# Patient Record
Sex: Female | Born: 1955
Health system: Southern US, Community
[De-identification: ages and names within clinical notes are randomized; demographics above are authoritative.]

## PROBLEM LIST (undated history)

## (undated) DIAGNOSIS — E782 Mixed hyperlipidemia: Secondary | ICD-10-CM

## (undated) DIAGNOSIS — I10 Essential (primary) hypertension: Secondary | ICD-10-CM

## (undated) DIAGNOSIS — I2510 Atherosclerotic heart disease of native coronary artery without angina pectoris: Secondary | ICD-10-CM

## (undated) DIAGNOSIS — E039 Hypothyroidism, unspecified: Secondary | ICD-10-CM

## (undated) DIAGNOSIS — E7800 Pure hypercholesterolemia, unspecified: Secondary | ICD-10-CM

## (undated) HISTORY — DX: Essential (primary) hypertension: I10

## (undated) HISTORY — DX: Hypothyroidism, unspecified: E03.9

## (undated) HISTORY — DX: Atherosclerotic heart disease of native coronary artery without angina pectoris: I25.10

## (undated) HISTORY — DX: Mixed hyperlipidemia: E78.2

## (undated) HISTORY — DX: Pure hypercholesterolemia, unspecified: E78.00

## (undated) HISTORY — PX: TONSILLECTOMY AND ADENOIDECTOMY: SUR1326

---
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
## 1980-01-02 HISTORY — PX: TUBAL LIGATION: SHX77

## 1998-07-08 ENCOUNTER — Encounter: Admission: RE | Admit: 1998-07-08 | Discharge: 1998-10-06 | Payer: Self-pay | Admitting: *Deleted

## 2000-05-23 ENCOUNTER — Encounter (INDEPENDENT_AMBULATORY_CARE_PROVIDER_SITE_OTHER): Payer: Self-pay

## 2000-05-23 ENCOUNTER — Ambulatory Visit (HOSPITAL_COMMUNITY): Admission: RE | Admit: 2000-05-23 | Discharge: 2000-05-23 | Payer: Self-pay | Admitting: Obstetrics and Gynecology

## 2003-06-02 HISTORY — PX: URETHRA SURGERY: SHX824

## 2004-01-02 HISTORY — PX: CHOLECYSTECTOMY: SHX55

## 2005-06-06 HISTORY — PX: FOOT SURGERY: SHX648

## 2009-12-07 ENCOUNTER — Emergency Department (HOSPITAL_COMMUNITY)
Admission: EM | Admit: 2009-12-07 | Discharge: 2009-12-07 | Payer: Self-pay | Source: Home / Self Care | Admitting: Emergency Medicine

## 2010-03-14 LAB — POCT CARDIAC MARKERS
CKMB, poc: <1 ng/mL — ABNORMAL LOW (ref 1.0–8.0)
Myoglobin, poc: 153 ng/mL (ref 12–200)
Myoglobin, poc: 66.2 ng/mL (ref 12–200)
Troponin i, poc: <0.05 ng/mL (ref 0.00–0.09)

## 2010-05-19 NOTE — Op Note (Signed)
[facility]  Patient:    Crystal Fischer, Crystal Fischer                         MRN: [id number] Proc. Date: 05/23/00 Adm. Date:  [id number] Attending:  Lendon Colonel                           Operative Report  PREOPERATIVE DIAGNOSES:  Menorrhagia, uterine fibroids, and anemia.  POSTOPERATIVE DIAGNOSES:  Menorrhagia, uterine fibroids, and anemia.  OPERATION:  Curettage and hysteroscopy with resection.  DESCRIPTION OF PROCEDURE:  The patient was placed in lithotomy position and prepped and draped in the usual fashion.  Cervix dilated and injected with 20 cc of a Pitressin solution.  Hysteroscopy was performed which revealed a prominent lower segment but no definite submucous fibroids.  A general endometrial resection was accomplished without difficulty.  All of the resected material was sent to the lab for study.  At the completion of the resection, a complete curettage was performed to remove all chips.  No unusual blood loss occurred.  [name] tolerated the procedure well and sent to recovery in good condition. DD:  05/23/00 TD:  05/23/00 Job: [id number]

## 2014-07-28 DIAGNOSIS — C4492 Squamous cell carcinoma of skin, unspecified: Secondary | ICD-10-CM

## 2014-07-28 HISTORY — DX: Squamous cell carcinoma of skin, unspecified: C44.92

## 2014-12-20 DIAGNOSIS — C4492 Squamous cell carcinoma of skin, unspecified: Secondary | ICD-10-CM

## 2014-12-20 HISTORY — DX: Squamous cell carcinoma of skin, unspecified: C44.92

## 2015-02-22 ENCOUNTER — Ambulatory Visit (INDEPENDENT_AMBULATORY_CARE_PROVIDER_SITE_OTHER): Payer: 59 | Admitting: Endocrinology

## 2015-02-22 ENCOUNTER — Encounter: Payer: Self-pay | Admitting: Endocrinology

## 2015-02-22 VITALS — BP 146/83 | HR 72 | Temp 98.3°F | Ht 63.5 in | Wt 199.0 lb

## 2015-02-22 DIAGNOSIS — E039 Hypothyroidism, unspecified: Secondary | ICD-10-CM | POA: Insufficient documentation

## 2015-02-22 DIAGNOSIS — E785 Hyperlipidemia, unspecified: Secondary | ICD-10-CM | POA: Diagnosis not present

## 2015-02-22 DIAGNOSIS — I10 Essential (primary) hypertension: Secondary | ICD-10-CM | POA: Diagnosis not present

## 2015-02-22 MED ORDER — LEVOTHYROXINE SODIUM 200 MCG PO TABS: 200.0000 ug | ORAL_TABLET | Freq: Every day | ORAL | Status: DC

## 2015-02-22 NOTE — Progress Notes (Signed)
Subjective:    Patient ID: Crystal Fischer, female    DOB: [birth year], 60 y.o.   MRN: [id number]  HPI Pt reports hypothyroidism was dx'ed in approx 2014.  she has been on prescribed thyroid hormone therapy since then.  she has never taken kelp or any other type of non-prescribed thyroid product.  she has never had thyroid imaging.  She has never had thyroid surgery, or XRT to the neck.  He has never been on amiodarone or lithium.  Despite multiple med adjustments, and different meds, she has moderate cold intolerance throughout the body, and assoc fatigue.  When she was on synthroid, she says dosage ranged from 150-225 mcg/d.   No past medical history on file.  No past surgical history on file.  Social History   Social History  . Marital Status: Married    Spouse Name: N/A  . Number of Children: N/A  . Years of Education: N/A   Occupational History  . Not on file.   Social History Main Topics  . Smoking status: Former Research scientist (life sciences)  . Smokeless tobacco: Not on file  . Alcohol Use: No  . Drug Use: Not on file  . Sexual Activity: Not on file   Other Topics Concern  . Not on file   Social History Narrative  . No narrative on file    No current outpatient prescriptions on file prior to visit.   No current facility-administered medications on file prior to visit.    Allergies  Allergen Reactions  . Codeine Nausea Only    Family History  Problem Relation Age of Onset  . Thyroid disease Neg Hx     BP 146/83 mmHg  Pulse 72  Temp(Src) 98.3 F (36.8 C) (Oral)  Ht 5' 3.5" (1.613 m)  Wt 199 lb (90.266 kg)  BMI 34.69 kg/m2  SpO2 96%  Review of Systems denies depression, hair loss, weight gain, constipation, numbness, blurry vision, easy bruising, and syncope.  She has difficulty with concentration, muscle cramps, doe, rhinorrhea, and dry skin.      Objective:   Physical Exam VS: see vs page GEN: no distress HEAD: head: no deformity eyes: no periorbital swelling, no  proptosis external nose and ears are normal mouth: no lesion seen NECK: supple, thyroid is not enlarged CHEST WALL: no deformity LUNGS:  Clear to auscultation CV: reg rate and rhythm; soft systolic murmur ABD: abdomen is soft, nontender.  no hepatosplenomegaly.  not distended.  no hernia.   MUSCULOSKELETAL: muscle bulk and strength are grossly normal.  no obvious joint swelling.  gait is normal and steady EXTEMITIES: no deformity.  1+ bilat leg edema.   PULSES: no carotid bruit NEURO:  cn 2-12 grossly intact.   readily moves all 4's.  sensation is intact to touch on all 4's.  SKIN:  Normal texture and temperature.  No rash or suspicious lesion is visible.   NODES:  None palpable at the neck PSYCH: alert, well-oriented.  Does not appear anxious nor depressed.  I have reviewed outside records, and summarized: Pt was noted to have variable TSH, and referred here.  outside test results are reviewed: TSH=8     Assessment & Plan:  Hypothyroidism: new to me: characterized by variable TFT Cold intolerance and other sxs: unlikely thyroid-related.   Patient is advised the following: Patient Instructions  Please change your current thyroid medication to synthroid, 200 mcg/day Please redo the blood test in 1 month, here in the office.      Levothyroxine  oral capsules What is this medicine? LEVOTHYROXINE (lee voe thye ROX een) is a thyroid hormone. This medicine can improve symptoms of thyroid deficiency such as slow speech, lack of energy, weight gain, hair loss, dry skin, and feeling cold. It also helps to treat goiter (an enlarged thyroid gland). It is also used to treat some kinds of thyroid cancer along with surgery and other medicines. This medicine may be used for other purposes; ask your health care provider or pharmacist if you have questions. What should I tell my health care provider before I take this medicine? They need to know if you have any of these  conditions: -angina -blood clotting problems -diabetes -dieting or on a weight loss program -fertility problems -heart disease -high levels of thyroid hormone -pituitary gland problem -previous heart attack -an unusual or allergic reaction to levothyroxine, thyroid hormones, other medicines, foods, dyes, or preservatives -pregnant or trying to get pregnant -breast-feeding How should I use this medicine? Take this medicine by mouth with a glass of water. It is best to take on an empty stomach, at least 30 minutes to one hour before breakfast. Avoid taking antacids containing aluminum or magnesium, simethicone, bile acid sequestrants, calcium carbonate, sodium polystyrene sulfonate, ferrous sulfate, and sucralfate within 4 hours of taking this medicine. Do not cut, crush or chew this medicine. Follow the directions on the prescription label. Take at the same time each day. Do not take your medicine more often than directed. Talk to your pediatrician regarding the use of this medicine in children. While this drug may be prescribed for selected conditions, precautions do apply. Since the capsules cannot be crushed or placed in water, they may only be given to infants and children who are able to swallow an intact capsule. Overdosage: If you think you have taken too much of this medicine contact a poison control center or emergency room at once. NOTE: This medicine is only for you. Do not share this medicine with others. What if I miss a dose? If you miss a dose, take it as soon as you can. If it is almost time for your next dose, take only that dose. Do not take double or extra doses. What may interact with this medicine? -amiodarone -antacids -anti-thyroid medicines -calcium supplements -carbamazepine -cholestyramine -colestipol -digoxin -female hormones, including contraceptive or birth control pills -iron supplements -ketamine -liquid nutrition products like Ensure -medicines for  colds and breathing difficulties -medicines for diabetes -medicines for mental depression -medicines or herbals used to decrease weight or appetite -phenobarbital or other barbiturate medications -phenytoin -prednisone or other corticosteroids -rifabutin -rifampin -simethicone -sodium polystyrene sulfonate -soy isoflavones -sucralfate -theophylline -warfarin This list may not describe all possible interactions. Give your health care provider a list of all the medicines, herbs, non-prescription drugs, or dietary supplements you use. Also tell them if you smoke, drink alcohol, or use illegal drugs. Some items may interact with your medicine. What should I watch for while using this medicine? Do not switch brands of this medicine unless your health care professional agrees with the change. Ask questions if you are uncertain. You will need regular exams and occasional blood tests to check the response to treatment. If you are receiving this medicine for an underactive thyroid, it may be several weeks before you notice an improvement. Check with your doctor or health care professional if your symptoms do not improve. It may be necessary for you to take this medicine for the rest of your life. Do not stop using this  medicine unless your doctor or health care professional advises you to. This medicine can affect blood sugar levels. If you have diabetes, check your blood sugar as directed. You may lose some of your hair when you first start treatment. With time, this usually corrects itself. If you are going to have surgery, tell your doctor or health care professional that you are taking this medicine. What side effects may I notice from receiving this medicine? Side effects that you should report to your doctor or health care professional as soon as possible: -allergic reactions like skin rash, itching or hives, swelling of the face, lips, or tongue -chest pain -excessive sweating or intolerance  to heat -fast or irregular heartbeat -nervousness -swelling of ankles, feet, or legs -tremors Side effects that usually do not require medical attention (Report these to your doctor or health care professional if they continue or are bothersome.): -changes in appetite -changes in menstrual periods -diarrhea -hair loss -headache -trouble sleeping -weight loss This list may not describe all possible side effects. Call your doctor for medical advice about side effects. You may report side effects to FDA at 1-800-FDA-1088. Where should I keep my medicine? Keep out of the reach of children. Store at room temperature between 15 and 30 degrees C (59 and 86 degrees F). Protect from light and moisture. Keep container tightly closed. Throw away any unused medicine after the expiration date. NOTE: This sheet is a summary. It may not cover all possible information. If you have questions about this medicine, talk to your doctor, pharmacist, or health care provider.    2016, Elsevier/Gold Standard. (2008-03-11 15:25:58)

## 2015-02-22 NOTE — Patient Instructions (Signed)
Please change your current thyroid medication to synthroid, 200 mcg/day Please redo the blood test in 1 month, here in the office.      Levothyroxine oral capsules What is this medicine? LEVOTHYROXINE (lee voe thye ROX een) is a thyroid hormone. This medicine can improve symptoms of thyroid deficiency such as slow speech, lack of energy, weight gain, hair loss, dry skin, and feeling cold. It also helps to treat goiter (an enlarged thyroid gland). It is also used to treat some kinds of thyroid cancer along with surgery and other medicines. This medicine may be used for other purposes; ask your health care provider or pharmacist if you have questions. What should I tell my health care provider before I take this medicine? They need to know if you have any of these conditions: -angina -blood clotting problems -diabetes -dieting or on a weight loss program -fertility problems -heart disease -high levels of thyroid hormone -pituitary gland problem -previous heart attack -an unusual or allergic reaction to levothyroxine, thyroid hormones, other medicines, foods, dyes, or preservatives -pregnant or trying to get pregnant -breast-feeding How should I use this medicine? Take this medicine by mouth with a glass of water. It is best to take on an empty stomach, at least 30 minutes to one hour before breakfast. Avoid taking antacids containing aluminum or magnesium, simethicone, bile acid sequestrants, calcium carbonate, sodium polystyrene sulfonate, ferrous sulfate, and sucralfate within 4 hours of taking this medicine. Do not cut, crush or chew this medicine. Follow the directions on the prescription label. Take at the same time each day. Do not take your medicine more often than directed. Talk to your pediatrician regarding the use of this medicine in children. While this drug may be prescribed for selected conditions, precautions do apply. Since the capsules cannot be crushed or placed in water,  they may only be given to infants and children who are able to swallow an intact capsule. Overdosage: If you think you have taken too much of this medicine contact a poison control center or emergency room at once. NOTE: This medicine is only for you. Do not share this medicine with others. What if I miss a dose? If you miss a dose, take it as soon as you can. If it is almost time for your next dose, take only that dose. Do not take double or extra doses. What may interact with this medicine? -amiodarone -antacids -anti-thyroid medicines -calcium supplements -carbamazepine -cholestyramine -colestipol -digoxin -female hormones, including contraceptive or birth control pills -iron supplements -ketamine -liquid nutrition products like Ensure -medicines for colds and breathing difficulties -medicines for diabetes -medicines for mental depression -medicines or herbals used to decrease weight or appetite -phenobarbital or other barbiturate medications -phenytoin -prednisone or other corticosteroids -rifabutin -rifampin -simethicone -sodium polystyrene sulfonate -soy isoflavones -sucralfate -theophylline -warfarin This list may not describe all possible interactions. Give your health care provider a list of all the medicines, herbs, non-prescription drugs, or dietary supplements you use. Also tell them if you smoke, drink alcohol, or use illegal drugs. Some items may interact with your medicine. What should I watch for while using this medicine? Do not switch brands of this medicine unless your health care professional agrees with the change. Ask questions if you are uncertain. You will need regular exams and occasional blood tests to check the response to treatment. If you are receiving this medicine for an underactive thyroid, it may be several weeks before you notice an improvement. Check with your doctor or health care professional  if your symptoms do not improve. It may be  necessary for you to take this medicine for the rest of your life. Do not stop using this medicine unless your doctor or health care professional advises you to. This medicine can affect blood sugar levels. If you have diabetes, check your blood sugar as directed. You may lose some of your hair when you first start treatment. With time, this usually corrects itself. If you are going to have surgery, tell your doctor or health care professional that you are taking this medicine. What side effects may I notice from receiving this medicine? Side effects that you should report to your doctor or health care professional as soon as possible: -allergic reactions like skin rash, itching or hives, swelling of the face, lips, or tongue -chest pain -excessive sweating or intolerance to heat -fast or irregular heartbeat -nervousness -swelling of ankles, feet, or legs -tremors Side effects that usually do not require medical attention (Report these to your doctor or health care professional if they continue or are bothersome.): -changes in appetite -changes in menstrual periods -diarrhea -hair loss -headache -trouble sleeping -weight loss This list may not describe all possible side effects. Call your doctor for medical advice about side effects. You may report side effects to FDA at 1-800-FDA-1088. Where should I keep my medicine? Keep out of the reach of children. Store at room temperature between 15 and 30 degrees C (59 and 86 degrees F). Protect from light and moisture. Keep container tightly closed. Throw away any unused medicine after the expiration date. NOTE: This sheet is a summary. It may not cover all possible information. If you have questions about this medicine, talk to your doctor, pharmacist, or health care provider.    2016, Elsevier/Gold Standard. (2008-03-11 15:25:58)

## 2015-03-22 ENCOUNTER — Other Ambulatory Visit (INDEPENDENT_AMBULATORY_CARE_PROVIDER_SITE_OTHER): Payer: 59

## 2015-03-22 DIAGNOSIS — E039 Hypothyroidism, unspecified: Secondary | ICD-10-CM | POA: Diagnosis not present

## 2015-03-22 LAB — TSH: TSH: 0.49 u[IU]/mL (ref 0.35–4.50)

## 2015-03-22 LAB — T4, FREE: Free T4: 1.49 ng/dL (ref 0.60–1.60)

## 2015-04-08 ENCOUNTER — Ambulatory Visit (INDEPENDENT_AMBULATORY_CARE_PROVIDER_SITE_OTHER): Payer: 59 | Admitting: Cardiovascular Disease

## 2015-04-08 ENCOUNTER — Other Ambulatory Visit: Payer: Self-pay | Admitting: *Deleted

## 2015-04-08 ENCOUNTER — Encounter: Payer: Self-pay | Admitting: *Deleted

## 2015-04-08 VITALS — BP 148/84 | HR 76 | Ht 63.0 in | Wt 200.0 lb

## 2015-04-08 DIAGNOSIS — I10 Essential (primary) hypertension: Secondary | ICD-10-CM

## 2015-04-08 DIAGNOSIS — R011 Cardiac murmur, unspecified: Secondary | ICD-10-CM | POA: Diagnosis not present

## 2015-04-08 DIAGNOSIS — R0602 Shortness of breath: Secondary | ICD-10-CM

## 2015-04-08 DIAGNOSIS — E785 Hyperlipidemia, unspecified: Secondary | ICD-10-CM

## 2015-04-08 NOTE — Patient Instructions (Signed)
Your physician recommends that you schedule a follow-up appointment in: 3 week with Dr. Bronson Ing  Your physician has requested that you have an echocardiogram. Echocardiography is a painless test that uses sound waves to create images of your heart. It provides your doctor with information about the size and shape of your heart and how well your heart's chambers and valves are working. This procedure takes approximately one hour. There are no restrictions for this procedure.   Your physician has requested that you have en exercise stress myoview. For further information please visit [email or web address]. Please follow instruction sheet, as given.  Thank you for choosing [facility]!!

## 2015-04-08 NOTE — Progress Notes (Signed)
Patient ID: Crystal Fischer, female   DOB: June 29, 1955, 60 y.o.   MRN: [id number]       CARDIOLOGY CONSULT NOTE  Patient ID: Crystal Fischer MRN: [id number] DOB/AGE: 11/20/1955 61 y.o.  Admit date: (Not on file) Primary Physician Manon Hilding, MD  Reason for Consultation: shortness of breath  HPI: The patient is a 60 year old female with a history of hypertension, hyperlipidemia, hypothyroidism, and obesity, with a prior history of smoking but quit 20 years ago. Earlier this month she was pulling a 100 lb wagon outdoors up a hill and by the time she finished she was "seeing black spots" and experienced some shortness of breath. She denied chest pain, palpitations, and diaphoresis. Blood pressures have been ranging from 160-179/90-109. Labs performed on 04/06/15 showed hemoglobin 12.3, platelets 306, BUN 9, creatinine 0.68, TSH 0.288.  SheHas noticed a progressive decline in her exercise tolerance over the past 6-8 months. She has had progressive exertional dyspnea associated with throat and neck pain. She used to walk 3 miles daily and is now only able to walk 1 mile. She also complains of upper back pain and occasional mild dizziness but denies syncope. She has had leg swelling for the past several months and takes Lasix 20 mg.  She recently had an ECG done at PCPs office but this is not currently available to me.   Allergies  Allergen Reactions  . Codeine Nausea Only    Current Outpatient Prescriptions  Medication Sig Dispense Refill  . atorvastatin (LIPITOR) 10 MG tablet Take 10 mg by mouth. 2 times per week/;    . Calcium Carb-Cholecalciferol (CALCIUM 600 + D PO) Take by mouth.    . furosemide (LASIX) 20 MG tablet Take 20 mg by mouth.    . levothyroxine (SYNTHROID, LEVOTHROID) 200 MCG tablet Take 1 tablet (200 mcg total) by mouth daily before breakfast. 30 tablet 5  . lisinopril (PRINIVIL,ZESTRIL) 10 MG tablet Take 10 mg by mouth daily.    . Magnesium 250 MG TABS Take 250 mg by mouth.      . zolpidem (AMBIEN) 10 MG tablet Take 1 tablet by mouth at bedtime as needed.  3   No current facility-administered medications for this visit.    Past Medical History  Diagnosis Date  . Hypertension   . Hypercholesteremia   . Hypothyroidism     Past Surgical History  Procedure Laterality Date  . Tubal ligation  1982  . Foot surgery  06/06/05    bunion  . Cholecystectomy  2006  . Urethra surgery  06-2003    urethral suspension  . Appendectomy  1971  . Tonsillectomy and adenoidectomy      Social History   Social History  . Marital Status: Married    Spouse Name: N/A  . Number of Children: N/A  . Years of Education: N/A   Occupational History  . Not on file.   Social History Main Topics  . Smoking status: Former Smoker -- 20 years    Types: Cigarettes  . Smokeless tobacco: Never Used  . Alcohol Use: No  . Drug Use: Not on file  . Sexual Activity: Not on file   Other Topics Concern  . Not on file   Social History Narrative     No family history of premature CAD in 1st degree relatives.  Prior to Admission medications   Medication Sig Start Date End Date Taking? Authorizing Provider  atorvastatin (LIPITOR) 10 MG tablet Take 10 mg by mouth. 2 times per  week/;   Yes Historical Provider, MD  Calcium Carb-Cholecalciferol (CALCIUM 600 + D PO) Take by mouth.   Yes Historical Provider, MD  furosemide (LASIX) 20 MG tablet Take 20 mg by mouth.   Yes Historical Provider, MD  levothyroxine (SYNTHROID, LEVOTHROID) 200 MCG tablet Take 1 tablet (200 mcg total) by mouth daily before breakfast. 02/22/15  Yes Renato Shin, MD  lisinopril (PRINIVIL,ZESTRIL) 10 MG tablet Take 10 mg by mouth daily.   Yes Historical Provider, MD  Magnesium 250 MG TABS Take 250 mg by mouth.   Yes Historical Provider, MD  zolpidem (AMBIEN) 10 MG tablet Take 1 tablet by mouth at bedtime as needed. 03/03/15  Yes Historical Provider, MD     Review of systems complete and found to be negative unless  listed above in HPI     Physical exam Blood pressure 148/84, pulse 76, height 5\' 3"  (1.6 m), weight 200 lb (90.719 kg), SpO2 96 %. General: NAD Neck: No JVD, no thyromegaly or thyroid nodule.  Lungs: Clear to auscultation bilaterally with normal respiratory effort. CV: Nondisplaced PMI. Regular rate and rhythm, normal S1/S2, no [id number], soft 1/6 systolic murmur over RUSB and along LSB.  Trace pretibial edema.  No carotid bruit.    Abdomen: Soft, nontender, obese.  Skin: Intact without lesions or rashes.  Neurologic: Alert and oriented x 3.  Psych: Normal affect. Extremities: No clubbing or cyanosis.  HEENT: Normal.   ECG: Most recent ECG reviewed.  Labs:  No results found for: WBC, HGB, HCT, MCV, PLT No results for input(s): NA, K, CL, CO2, BUN, CREATININE, CALCIUM, PROT, BILITOT, ALKPHOS, ALT, AST, GLUCOSE in the last 168 hours.  Invalid input(s): LABALBU No results found for: CKTOTAL, CKMB, CKMBINDEX, TROPONINI No results found for: CHOL No results found for: HDL No results found for: LDLCALC No results found for: TRIG No results found for: CHOLHDL No results found for: LDLDIRECT       Studies: No results found.  ASSESSMENT AND PLAN:  1. Shortness of breath/decreased exercise tolerance: Given her progressive decline in exercise tolerance associated with throat pain, symptoms are concerning for occult ischemic heart disease. We'll proceed with an exercise Cardiolite stress test. I will order a 2-D echocardiogram with Doppler to evaluate cardiac structure, function, and regional wall motion. Will review ECG once made available.  2. Essential HTN: Mildly elevated. Will monitor without medication adjustments today.  3. Hyperlipidemia: Continue statin therapy.  4. Murmur: I will order a 2-D echocardiogram with Doppler to evaluate cardiac structure, function, and regional wall motion.   Dispo: fu 3 weeks.   Signed: Kate Sable, M.D., F.A.C.C.  04/08/2015, 8:42  AM

## 2015-04-15 ENCOUNTER — Inpatient Hospital Stay (HOSPITAL_COMMUNITY): Admission: RE | Admit: 2015-04-15 | Payer: 59 | Source: Ambulatory Visit

## 2015-04-15 ENCOUNTER — Encounter (HOSPITAL_COMMUNITY)
Admission: RE | Admit: 2015-04-15 | Discharge: 2015-04-15 | Disposition: A | Discharge status: Home / Self Care | Payer: 59 | Source: Ambulatory Visit | Attending: Cardiovascular Disease | Admitting: Cardiovascular Disease

## 2015-04-15 ENCOUNTER — Ambulatory Visit (HOSPITAL_COMMUNITY)
Admission: RE | Admit: 2015-04-15 | Discharge: 2015-04-15 | Disposition: A | Discharge status: Home / Self Care | Payer: 59 | Source: Ambulatory Visit | Attending: Cardiovascular Disease | Admitting: Cardiovascular Disease

## 2015-04-15 ENCOUNTER — Encounter (HOSPITAL_COMMUNITY): Payer: Self-pay

## 2015-04-15 DIAGNOSIS — I059 Rheumatic mitral valve disease, unspecified: Secondary | ICD-10-CM | POA: Insufficient documentation

## 2015-04-15 DIAGNOSIS — I358 Other nonrheumatic aortic valve disorders: Secondary | ICD-10-CM | POA: Diagnosis not present

## 2015-04-15 DIAGNOSIS — R0602 Shortness of breath: Secondary | ICD-10-CM | POA: Diagnosis not present

## 2015-04-15 DIAGNOSIS — I119 Hypertensive heart disease without heart failure: Secondary | ICD-10-CM | POA: Insufficient documentation

## 2015-04-15 DIAGNOSIS — E785 Hyperlipidemia, unspecified: Secondary | ICD-10-CM | POA: Diagnosis not present

## 2015-04-15 DIAGNOSIS — R011 Cardiac murmur, unspecified: Secondary | ICD-10-CM | POA: Diagnosis present

## 2015-04-15 LAB — NM MYOCAR MULTI W/SPECT W/WALL MOTION / EF
CHL CUP MPHR: 159 {beats}/min
CHL CUP NUCLEAR SDS: 5
CHL CUP NUCLEAR SSS: 12
CSEPHR: 91 %
Estimated workload: 10.1 METS
Exercise duration (min): 7 min
Exercise duration (sec): 16 s
LHR: 0.23
LV dias vol: 56 mL (ref 46–106)
LV sys vol: 7 mL
Peak HR: 146 {beats}/min
RPE: 15
Rest HR: 67 {beats}/min
SRS: 7
TID: 0.91

## 2015-04-15 MED ORDER — TECHNETIUM TC 99M SESTAMIBI GENERIC - CARDIOLITE
10.0000 | Freq: Once | INTRAVENOUS | Status: AC | PRN
  Administered 2015-04-15: 10 via INTRAVENOUS

## 2015-04-15 MED ORDER — TECHNETIUM TC 99M SESTAMIBI - CARDIOLITE
30.0000 | Freq: Once | INTRAVENOUS | Status: AC | PRN
  Administered 2015-04-15: 10:00:00 30 via INTRAVENOUS

## 2015-04-15 MED ORDER — SODIUM CHLORIDE 0.9% FLUSH
INTRAVENOUS | Status: AC
  Filled 2015-04-15: qty 100

## 2015-05-03 ENCOUNTER — Ambulatory Visit (INDEPENDENT_AMBULATORY_CARE_PROVIDER_SITE_OTHER): Payer: 59 | Admitting: Cardiovascular Disease

## 2015-05-03 ENCOUNTER — Encounter: Payer: Self-pay | Admitting: Cardiovascular Disease

## 2015-05-03 VITALS — BP 122/80 | HR 69 | Ht 63.0 in | Wt 204.0 lb

## 2015-05-03 DIAGNOSIS — Z7182 Exercise counseling: Secondary | ICD-10-CM

## 2015-05-03 DIAGNOSIS — E785 Hyperlipidemia, unspecified: Secondary | ICD-10-CM

## 2015-05-03 DIAGNOSIS — R0602 Shortness of breath: Secondary | ICD-10-CM

## 2015-05-03 DIAGNOSIS — R011 Cardiac murmur, unspecified: Secondary | ICD-10-CM

## 2015-05-03 DIAGNOSIS — I10 Essential (primary) hypertension: Secondary | ICD-10-CM

## 2015-05-03 DIAGNOSIS — Z7189 Other specified counseling: Secondary | ICD-10-CM

## 2015-05-03 NOTE — Patient Instructions (Signed)
Your physician recommends that you schedule a follow-up appointment in: AS NEEDED WITH DR KONESWARAN  Your physician recommends that you continue on your current medications as directed. Please refer to the Current Medication list given to you today.  Thank you for choosing [facility]!!    

## 2015-05-03 NOTE — Progress Notes (Signed)
Patient ID: Crystal Fischer, female   DOB: 01-Dec-1955, 60 y.o.   MRN: [id number]      SUBJECTIVE: The patient returns for follow-up after undergoing cardiovascular testing performed for the evaluation of shortness of breath.  Nuclear stress test 04/15/15 normal.  Echocardiogram showed normal left ventricular systolic function and regional wall motion with grade 1 diastolic dysfunction and aortic valve sclerosis without stenosis.  She has begun to feel better with less exertional dyspnea. She used to walk 3 miles daily but stopped doing so in the winter. She has a treadmill at home and at work but has not been using it. She now feels more motivated to begin exercising again.  Review of Systems: As per "subjective", otherwise negative.  Allergies  Allergen Reactions  . Codeine Nausea Only    Current Outpatient Prescriptions  Medication Sig Dispense Refill  . atorvastatin (LIPITOR) 10 MG tablet Take 10 mg by mouth. 2 times per week/;    . Calcium Carb-Cholecalciferol (CALCIUM 600 + D PO) Take by mouth.    . furosemide (LASIX) 20 MG tablet Take 20 mg by mouth.    . levothyroxine (SYNTHROID, LEVOTHROID) 175 MCG tablet Take 175 mcg by mouth daily before breakfast.    . lisinopril (PRINIVIL,ZESTRIL) 10 MG tablet Take 10 mg by mouth daily.    . Magnesium 250 MG TABS Take 250 mg by mouth.    . zolpidem (AMBIEN) 10 MG tablet Take 1 tablet by mouth at bedtime as needed.  3   No current facility-administered medications for this visit.    Past Medical History  Diagnosis Date  . Hypertension   . Hypercholesteremia   . Hypothyroidism     Past Surgical History  Procedure Laterality Date  . Tubal ligation  1982  . Foot surgery  06/06/05    bunion  . Cholecystectomy  2006  . Urethra surgery  06-2003    urethral suspension  . Appendectomy  1971  . Tonsillectomy and adenoidectomy      Social History   Social History  . Marital Status: Married    Spouse Name: N/A  . Number of Children:  N/A  . Years of Education: N/A   Occupational History  . Not on file.   Social History Main Topics  . Smoking status: Former Smoker -- 20 years    Types: Cigarettes  . Smokeless tobacco: Never Used  . Alcohol Use: No  . Drug Use: Not on file  . Sexual Activity: Not on file   Other Topics Concern  . Not on file   Social History Narrative     Filed Vitals:   05/03/15 1602  BP: 122/80  Pulse: 69  Height: 5\' 3"  (1.6 m)  Weight: 204 lb (92.534 kg)  SpO2: 96%    PHYSICAL EXAM General: NAD Neck: No JVD, no thyromegaly or thyroid nodule.  Lungs: Clear to auscultation bilaterally with normal respiratory effort. CV: Nondisplaced PMI. Regular rate and rhythm, normal S1/S2, no XX123456, soft 1/6 systolic murmur over RUSB and along LSB. Trace pretibial edema. No carotid bruit.  Abdomen: Soft, nontender, obese.  Skin: Intact without lesions or rashes.  Neurologic: Alert and oriented x 3.  Psych: Normal affect. Extremities: No clubbing or cyanosis.  HEENT: Normal.   ECG: Most recent ECG reviewed.      ASSESSMENT AND PLAN: 1. Shortness of breath/decreased exercise tolerance: Normal nuclear stress test with normal LV systolic function and regional wall motion. No further CV testing indicated. Likely due to CV deconditioning.  Exercise encouraged.  2. Essential HTN: Controlled. No changes.  3. Hyperlipidemia: Continue statin therapy.  4. Murmur: Aortic valve sclerosis without stenosis.   Dispo: fu prn.   Kate Sable, M.D., F.A.C.C.

## 2016-01-23 DIAGNOSIS — E782 Mixed hyperlipidemia: Secondary | ICD-10-CM | POA: Diagnosis not present

## 2016-01-23 DIAGNOSIS — E539 Vitamin B deficiency, unspecified: Secondary | ICD-10-CM | POA: Diagnosis not present

## 2016-01-23 DIAGNOSIS — E7800 Pure hypercholesterolemia, unspecified: Secondary | ICD-10-CM | POA: Diagnosis not present

## 2016-01-23 DIAGNOSIS — I10 Essential (primary) hypertension: Secondary | ICD-10-CM | POA: Diagnosis not present

## 2016-01-23 DIAGNOSIS — E038 Other specified hypothyroidism: Secondary | ICD-10-CM | POA: Diagnosis not present

## 2016-02-02 DIAGNOSIS — E038 Other specified hypothyroidism: Secondary | ICD-10-CM | POA: Diagnosis not present

## 2016-02-02 DIAGNOSIS — E782 Mixed hyperlipidemia: Secondary | ICD-10-CM | POA: Diagnosis not present

## 2016-03-21 DIAGNOSIS — M25512 Pain in left shoulder: Secondary | ICD-10-CM | POA: Diagnosis not present

## 2016-03-23 DIAGNOSIS — M25512 Pain in left shoulder: Secondary | ICD-10-CM | POA: Diagnosis not present

## 2016-03-29 DIAGNOSIS — M25512 Pain in left shoulder: Secondary | ICD-10-CM | POA: Diagnosis not present

## 2016-04-24 DIAGNOSIS — R300 Dysuria: Secondary | ICD-10-CM | POA: Diagnosis not present

## 2016-04-24 DIAGNOSIS — N3000 Acute cystitis without hematuria: Secondary | ICD-10-CM | POA: Diagnosis not present

## 2016-05-22 DIAGNOSIS — R300 Dysuria: Secondary | ICD-10-CM | POA: Diagnosis not present

## 2016-06-19 DIAGNOSIS — H40053 Ocular hypertension, bilateral: Secondary | ICD-10-CM | POA: Diagnosis not present

## 2016-07-16 DIAGNOSIS — I10 Essential (primary) hypertension: Secondary | ICD-10-CM | POA: Diagnosis not present

## 2016-07-16 DIAGNOSIS — E038 Other specified hypothyroidism: Secondary | ICD-10-CM | POA: Diagnosis not present

## 2016-07-16 DIAGNOSIS — E782 Mixed hyperlipidemia: Secondary | ICD-10-CM | POA: Diagnosis not present

## 2016-07-16 DIAGNOSIS — E7800 Pure hypercholesterolemia, unspecified: Secondary | ICD-10-CM | POA: Diagnosis not present

## 2016-07-16 DIAGNOSIS — R5383 Other fatigue: Secondary | ICD-10-CM | POA: Diagnosis not present

## 2016-07-18 DIAGNOSIS — E782 Mixed hyperlipidemia: Secondary | ICD-10-CM | POA: Diagnosis not present

## 2016-07-18 DIAGNOSIS — E038 Other specified hypothyroidism: Secondary | ICD-10-CM | POA: Diagnosis not present

## 2016-08-13 DIAGNOSIS — Z01419 Encounter for gynecological examination (general) (routine) without abnormal findings: Secondary | ICD-10-CM | POA: Diagnosis not present

## 2016-08-13 DIAGNOSIS — R39198 Other difficulties with micturition: Secondary | ICD-10-CM | POA: Diagnosis not present

## 2016-08-22 DIAGNOSIS — L570 Actinic keratosis: Secondary | ICD-10-CM | POA: Diagnosis not present

## 2016-08-22 DIAGNOSIS — D229 Melanocytic nevi, unspecified: Secondary | ICD-10-CM | POA: Diagnosis not present

## 2016-08-22 DIAGNOSIS — L821 Other seborrheic keratosis: Secondary | ICD-10-CM | POA: Diagnosis not present

## 2016-08-30 DIAGNOSIS — E038 Other specified hypothyroidism: Secondary | ICD-10-CM | POA: Diagnosis not present

## 2016-10-09 DIAGNOSIS — M654 Radial styloid tenosynovitis [de Quervain]: Secondary | ICD-10-CM | POA: Diagnosis not present

## 2016-11-08 DIAGNOSIS — E7800 Pure hypercholesterolemia, unspecified: Secondary | ICD-10-CM | POA: Diagnosis not present

## 2016-11-08 DIAGNOSIS — I10 Essential (primary) hypertension: Secondary | ICD-10-CM | POA: Diagnosis not present

## 2016-11-08 DIAGNOSIS — E038 Other specified hypothyroidism: Secondary | ICD-10-CM | POA: Diagnosis not present

## 2016-11-08 DIAGNOSIS — E782 Mixed hyperlipidemia: Secondary | ICD-10-CM | POA: Diagnosis not present

## 2016-11-13 DIAGNOSIS — E782 Mixed hyperlipidemia: Secondary | ICD-10-CM | POA: Diagnosis not present

## 2016-11-13 DIAGNOSIS — E038 Other specified hypothyroidism: Secondary | ICD-10-CM | POA: Diagnosis not present

## 2017-01-25 DIAGNOSIS — Z7689 Persons encountering health services in other specified circumstances: Secondary | ICD-10-CM | POA: Diagnosis not present

## 2017-01-25 DIAGNOSIS — N3001 Acute cystitis with hematuria: Secondary | ICD-10-CM | POA: Diagnosis not present

## 2017-03-07 DIAGNOSIS — E782 Mixed hyperlipidemia: Secondary | ICD-10-CM | POA: Diagnosis not present

## 2017-03-07 DIAGNOSIS — E0580 Other thyrotoxicosis without thyrotoxic crisis or storm: Secondary | ICD-10-CM | POA: Diagnosis not present

## 2017-03-07 DIAGNOSIS — I10 Essential (primary) hypertension: Secondary | ICD-10-CM | POA: Diagnosis not present

## 2017-03-07 DIAGNOSIS — R609 Edema, unspecified: Secondary | ICD-10-CM | POA: Diagnosis not present

## 2017-03-12 DIAGNOSIS — E038 Other specified hypothyroidism: Secondary | ICD-10-CM | POA: Diagnosis not present

## 2017-03-12 DIAGNOSIS — E782 Mixed hyperlipidemia: Secondary | ICD-10-CM | POA: Diagnosis not present

## 2017-04-29 DIAGNOSIS — J309 Allergic rhinitis, unspecified: Secondary | ICD-10-CM | POA: Diagnosis not present

## 2017-04-29 DIAGNOSIS — R05 Cough: Secondary | ICD-10-CM | POA: Diagnosis not present

## 2017-04-29 DIAGNOSIS — J029 Acute pharyngitis, unspecified: Secondary | ICD-10-CM | POA: Diagnosis not present

## 2017-05-04 DIAGNOSIS — R05 Cough: Secondary | ICD-10-CM | POA: Diagnosis not present

## 2017-07-09 DIAGNOSIS — H35033 Hypertensive retinopathy, bilateral: Secondary | ICD-10-CM | POA: Diagnosis not present

## 2017-07-23 DIAGNOSIS — Z1231 Encounter for screening mammogram for malignant neoplasm of breast: Secondary | ICD-10-CM | POA: Diagnosis not present

## 2017-07-23 DIAGNOSIS — Z01419 Encounter for gynecological examination (general) (routine) without abnormal findings: Secondary | ICD-10-CM | POA: Diagnosis not present

## 2017-09-03 DIAGNOSIS — D229 Melanocytic nevi, unspecified: Secondary | ICD-10-CM | POA: Diagnosis not present

## 2017-09-03 DIAGNOSIS — D485 Neoplasm of uncertain behavior of skin: Secondary | ICD-10-CM | POA: Diagnosis not present

## 2017-09-03 DIAGNOSIS — L719 Rosacea, unspecified: Secondary | ICD-10-CM | POA: Diagnosis not present

## 2017-09-09 DIAGNOSIS — E038 Other specified hypothyroidism: Secondary | ICD-10-CM | POA: Diagnosis not present

## 2017-09-09 DIAGNOSIS — R5383 Other fatigue: Secondary | ICD-10-CM | POA: Diagnosis not present

## 2017-09-09 DIAGNOSIS — E782 Mixed hyperlipidemia: Secondary | ICD-10-CM | POA: Diagnosis not present

## 2017-09-09 DIAGNOSIS — I10 Essential (primary) hypertension: Secondary | ICD-10-CM | POA: Diagnosis not present

## 2017-09-12 DIAGNOSIS — E038 Other specified hypothyroidism: Secondary | ICD-10-CM | POA: Diagnosis not present

## 2017-09-12 DIAGNOSIS — E782 Mixed hyperlipidemia: Secondary | ICD-10-CM | POA: Diagnosis not present

## 2017-11-07 DIAGNOSIS — Z6836 Body mass index (BMI) 36.0-36.9, adult: Secondary | ICD-10-CM | POA: Diagnosis not present

## 2017-11-07 DIAGNOSIS — N3000 Acute cystitis without hematuria: Secondary | ICD-10-CM | POA: Diagnosis not present

## 2017-11-29 DIAGNOSIS — Z6836 Body mass index (BMI) 36.0-36.9, adult: Secondary | ICD-10-CM | POA: Diagnosis not present

## 2017-11-29 DIAGNOSIS — R300 Dysuria: Secondary | ICD-10-CM | POA: Diagnosis not present

## 2017-12-18 DIAGNOSIS — H905 Unspecified sensorineural hearing loss: Secondary | ICD-10-CM | POA: Diagnosis not present

## 2018-01-28 DIAGNOSIS — D485 Neoplasm of uncertain behavior of skin: Secondary | ICD-10-CM | POA: Diagnosis not present

## 2018-01-28 DIAGNOSIS — C44729 Squamous cell carcinoma of skin of left lower limb, including hip: Secondary | ICD-10-CM | POA: Diagnosis not present

## 2018-02-04 DIAGNOSIS — R7301 Impaired fasting glucose: Secondary | ICD-10-CM | POA: Diagnosis not present

## 2018-02-04 DIAGNOSIS — I10 Essential (primary) hypertension: Secondary | ICD-10-CM | POA: Diagnosis not present

## 2018-02-04 DIAGNOSIS — R5383 Other fatigue: Secondary | ICD-10-CM | POA: Diagnosis not present

## 2018-02-11 DIAGNOSIS — E6609 Other obesity due to excess calories: Secondary | ICD-10-CM | POA: Diagnosis not present

## 2018-02-11 DIAGNOSIS — E038 Other specified hypothyroidism: Secondary | ICD-10-CM | POA: Diagnosis not present

## 2018-02-11 DIAGNOSIS — E782 Mixed hyperlipidemia: Secondary | ICD-10-CM | POA: Diagnosis not present

## 2019-01-15 DIAGNOSIS — E782 Mixed hyperlipidemia: Secondary | ICD-10-CM | POA: Diagnosis not present

## 2019-01-15 DIAGNOSIS — E0580 Other thyrotoxicosis without thyrotoxic crisis or storm: Secondary | ICD-10-CM | POA: Diagnosis not present

## 2019-01-15 DIAGNOSIS — E7800 Pure hypercholesterolemia, unspecified: Secondary | ICD-10-CM | POA: Diagnosis not present

## 2019-01-15 DIAGNOSIS — I10 Essential (primary) hypertension: Secondary | ICD-10-CM | POA: Diagnosis not present

## 2019-01-15 DIAGNOSIS — R5383 Other fatigue: Secondary | ICD-10-CM | POA: Diagnosis not present

## 2019-01-15 DIAGNOSIS — R7303 Prediabetes: Secondary | ICD-10-CM | POA: Diagnosis not present

## 2019-01-15 DIAGNOSIS — E038 Other specified hypothyroidism: Secondary | ICD-10-CM | POA: Diagnosis not present

## 2019-01-22 DIAGNOSIS — Z23 Encounter for immunization: Secondary | ICD-10-CM | POA: Diagnosis not present

## 2019-01-22 DIAGNOSIS — M542 Cervicalgia: Secondary | ICD-10-CM | POA: Diagnosis not present

## 2019-01-22 DIAGNOSIS — R7301 Impaired fasting glucose: Secondary | ICD-10-CM | POA: Diagnosis not present

## 2019-01-22 DIAGNOSIS — E782 Mixed hyperlipidemia: Secondary | ICD-10-CM | POA: Diagnosis not present

## 2019-01-22 DIAGNOSIS — I10 Essential (primary) hypertension: Secondary | ICD-10-CM | POA: Diagnosis not present

## 2019-01-22 DIAGNOSIS — Z6835 Body mass index (BMI) 35.0-35.9, adult: Secondary | ICD-10-CM | POA: Diagnosis not present

## 2019-01-22 DIAGNOSIS — R5383 Other fatigue: Secondary | ICD-10-CM | POA: Diagnosis not present

## 2019-01-22 DIAGNOSIS — R7303 Prediabetes: Secondary | ICD-10-CM | POA: Diagnosis not present

## 2019-03-10 DIAGNOSIS — M47812 Spondylosis without myelopathy or radiculopathy, cervical region: Secondary | ICD-10-CM | POA: Diagnosis not present

## 2019-03-10 DIAGNOSIS — M9901 Segmental and somatic dysfunction of cervical region: Secondary | ICD-10-CM | POA: Diagnosis not present

## 2019-03-12 DIAGNOSIS — J309 Allergic rhinitis, unspecified: Secondary | ICD-10-CM | POA: Diagnosis not present

## 2019-03-12 DIAGNOSIS — E782 Mixed hyperlipidemia: Secondary | ICD-10-CM | POA: Diagnosis not present

## 2019-03-12 DIAGNOSIS — E669 Obesity, unspecified: Secondary | ICD-10-CM | POA: Diagnosis not present

## 2019-03-12 DIAGNOSIS — R079 Chest pain, unspecified: Secondary | ICD-10-CM | POA: Diagnosis not present

## 2019-03-12 DIAGNOSIS — I10 Essential (primary) hypertension: Secondary | ICD-10-CM | POA: Diagnosis not present

## 2019-04-09 ENCOUNTER — Ambulatory Visit: Payer: Self-pay | Admitting: Cardiovascular Disease

## 2019-04-13 ENCOUNTER — Encounter: Payer: Self-pay | Admitting: *Deleted

## 2019-04-14 ENCOUNTER — Other Ambulatory Visit: Payer: Self-pay

## 2019-04-14 ENCOUNTER — Ambulatory Visit (INDEPENDENT_AMBULATORY_CARE_PROVIDER_SITE_OTHER): Payer: Medicare HMO | Admitting: Cardiovascular Disease

## 2019-04-14 ENCOUNTER — Encounter: Payer: Self-pay | Admitting: *Deleted

## 2019-04-14 ENCOUNTER — Encounter: Payer: Self-pay | Admitting: Cardiovascular Disease

## 2019-04-14 VITALS — BP 118/78 | HR 75 | Ht 63.0 in | Wt 211.0 lb

## 2019-04-14 DIAGNOSIS — R072 Precordial pain: Secondary | ICD-10-CM | POA: Diagnosis not present

## 2019-04-14 DIAGNOSIS — R011 Cardiac murmur, unspecified: Secondary | ICD-10-CM | POA: Diagnosis not present

## 2019-04-14 DIAGNOSIS — I10 Essential (primary) hypertension: Secondary | ICD-10-CM | POA: Diagnosis not present

## 2019-04-14 DIAGNOSIS — E7800 Pure hypercholesterolemia, unspecified: Secondary | ICD-10-CM

## 2019-04-14 NOTE — Progress Notes (Signed)
CARDIOLOGY CONSULT NOTE  Patient ID: Crystal Fischer MRN: [id number] DOB/AGE: Nov 13, 1955 64 y.o.  Admit date: (Not on file) Primary Physician: Manon Hilding, MD  Reason for Consultation: Chest pain  HPI: Crystal Fischer is a 64 y.o. female who is being seen today for the evaluation of chest pain at the request of Sasser, Silvestre Moment, MD.   I reviewed records from her PCP.  Past medical history includes hypertension, hyperlipidemia, and hypothyroidism.  Review the ECG performed on 03/12/2019 which demonstrates sinus rhythm with no ischemic ST segment or T wave abnormalities, nor any arrhythmias.  I last saw her in May 2017.  She underwent a normal nuclear stress test on 04/15/2015.  At that time echocardiogram demonstrated normal LV systolic function and regional wall motion with grade 1 diastolic dysfunction and aortic valve sclerosis without stenosis.  She has chronic exertional dyspnea which might be slightly worse which she attributes to weight gain and being out of shape.  She has been having intermittent episodes of an upper, centrally located chest heaviness with a sense of fullness in her neck.  She has awoken with the sensations.  She has chronic bilateral ankle swelling.  She has a history of snoring and is uncertain if she actually has sleep apnea.     Allergies  Allergen Reactions  . Codeine Nausea Only    Current Outpatient Medications  Medication Sig Dispense Refill  . atorvastatin (LIPITOR) 10 MG tablet Take 10 mg by mouth. 3 times per week/;    . Calcium Carb-Cholecalciferol (CALCIUM 600 + D PO) Take by mouth.    . furosemide (LASIX) 40 MG tablet Take 40 mg by mouth daily.    Marland Kitchen levothyroxine (SYNTHROID) 200 MCG tablet Take 200 mcg by mouth every other day.    . levothyroxine (SYNTHROID, LEVOTHROID) 175 MCG tablet Take 175 mcg by mouth every other day.     . lisinopril (PRINIVIL,ZESTRIL) 10 MG tablet Take 10 mg by mouth daily.    . Magnesium 250 MG TABS Take 250  mg by mouth.    . zolpidem (AMBIEN) 10 MG tablet Take 1 tablet by mouth at bedtime as needed.  3   No current facility-administered medications for this visit.    Past Medical History:  Diagnosis Date  . Hypercholesteremia   . Hypertension   . Hypothyroidism     Past Surgical History:  Procedure Laterality Date  . APPENDECTOMY  1971  . CHOLECYSTECTOMY  2006  . FOOT SURGERY  06/06/05   bunion  . TONSILLECTOMY AND ADENOIDECTOMY    . TUBAL LIGATION  1982  . URETHRA SURGERY  06-2003   urethral suspension    Social History   Socioeconomic History  . Marital status: Married    Spouse name: Not on file  . Number of children: Not on file  . Years of education: Not on file  . Highest education level: Not on file  Occupational History  . Not on file  Tobacco Use  . Smoking status: Former Smoker    Years: 20.00    Types: Cigarettes  . Smokeless tobacco: Never Used  Substance and Sexual Activity  . Alcohol use: No    Alcohol/week: 0.0 standard drinks  . Drug use: Not on file  . Sexual activity: Not on file  Other Topics Concern  . Not on file  Social History Narrative  . Not on file   Social Determinants of Health   Financial Resource Strain:   .  Difficulty of Paying Living Expenses:   Food Insecurity:   . Worried About Charity fundraiser in the Last Year:   . Arboriculturist in the Last Year:   Transportation Needs:   . Film/video editor (Medical):   Marland Kitchen Lack of Transportation (Non-Medical):   Physical Activity:   . Days of Exercise per Week:   . Minutes of Exercise per Session:   Stress:   . Feeling of Stress :   Social Connections:   . Frequency of Communication with Friends and Family:   . Frequency of Social Gatherings with Friends and Family:   . Attends Religious Services:   . Active Member of Clubs or Organizations:   . Attends Archivist Meetings:   Marland Kitchen Marital Status:   Intimate Partner Violence:   . Fear of Current or Ex-Partner:     . Emotionally Abused:   Marland Kitchen Physically Abused:   . Sexually Abused:      No family history of premature CAD in 1st degree relatives.  Current Meds  Medication Sig  . atorvastatin (LIPITOR) 10 MG tablet Take 10 mg by mouth. 3 times per week/;  . Calcium Carb-Cholecalciferol (CALCIUM 600 + D PO) Take by mouth.  . furosemide (LASIX) 40 MG tablet Take 40 mg by mouth daily.  Marland Kitchen levothyroxine (SYNTHROID) 200 MCG tablet Take 200 mcg by mouth every other day.  . levothyroxine (SYNTHROID, LEVOTHROID) 175 MCG tablet Take 175 mcg by mouth every other day.   . lisinopril (PRINIVIL,ZESTRIL) 10 MG tablet Take 10 mg by mouth daily.  . Magnesium 250 MG TABS Take 250 mg by mouth.  . zolpidem (AMBIEN) 10 MG tablet Take 1 tablet by mouth at bedtime as needed.  . [DISCONTINUED] furosemide (LASIX) 20 MG tablet Take 20 mg by mouth.      Review of systems complete and found to be negative unless listed above in HPI   [facility], LPN was present throughout the entirety of the encounter.  Physical exam Blood pressure 118/78, pulse 75, height 5\' 3"  (1.6 m), weight 211 lb (95.7 kg), SpO2 98 %. General: NAD Neck: No JVD, no thyromegaly or thyroid nodule.  Lungs: Clear to auscultation bilaterally with normal respiratory effort. CV: Nondisplaced PMI. Regular rate and rhythm, normal S1/S2, no XX123456, 2/6 systolic murmur loudest over RUSB.  No peripheral edema.  No carotid bruit.    Abdomen: Soft, nontender, no distention.  Skin: Intact without lesions or rashes.  Neurologic: Alert and oriented x 3.  Psych: Normal affect. Extremities: No clubbing or cyanosis.  HEENT: Normal.   ECG: Most recent ECG reviewed.   Labs: Lab Results  Component Value Date/Time   TSH 0.49 03/22/2015 02:44 PM     Lipids: No results found for: LDLCALC, LDLDIRECT, CHOL, TRIG, HDL      ASSESSMENT AND PLAN:   1.  Chest heaviness: Unclear etiology with atypical symptoms.  Cardiovascular is factors include hypertension and  hyperlipidemia.  Cardiac testing was unremarkable in 2017.  I will proceed with a nuclear myocardial perfusion imaging study to evaluate for ischemic heart disease (Lexiscan Myoview).  2.  Hypertension: Blood pressure is normal.  No changes to therapy.  3.  Hyperlipidemia: Continue statin.  4.  Cardiac murmur/aortic valve sclerosis: I will obtain a follow-up echocardiogram to assess for interval changes in aortic valve pathology.   Disposition: Follow up in 3 months virtual visit  Signed: Kate Sable, M.D., F.A.C.C.  04/14/2019, 3:14 PM

## 2019-04-14 NOTE — Patient Instructions (Addendum)
Medication Instructions:  Continue all other medications.    Labwork: none  Testing/Procedures:  Your physician has requested that you have an echocardiogram. Echocardiography is a painless test that uses sound waves to create images of your heart. It provides your doctor with information about the size and shape of your heart and how well your heart's chambers and valves are working. This procedure takes approximately one hour. There are no restrictions for this procedure.  Your physician has requested that you have a lexiscan myoview. For further information please visit [email or web address]. Please follow instruction sheet, as given.  Office will contact with results via phone or letter.    Follow-Up: 3 months   Any Other Special Instructions Will Be Listed Below (If Applicable).  If you need a refill on your cardiac medications before your next appointment, please call your pharmacy.

## 2019-04-15 ENCOUNTER — Telehealth: Payer: Self-pay | Admitting: Cardiovascular Disease

## 2019-04-15 NOTE — Telephone Encounter (Signed)
Pre-cert Verification for the following procedure    Lexiscan & Echo    DATE:  04/24/2019  LOCATION:  [facility]

## 2019-04-20 DIAGNOSIS — H2513 Age-related nuclear cataract, bilateral: Secondary | ICD-10-CM | POA: Diagnosis not present

## 2019-04-20 DIAGNOSIS — H35033 Hypertensive retinopathy, bilateral: Secondary | ICD-10-CM | POA: Diagnosis not present

## 2019-04-20 DIAGNOSIS — H524 Presbyopia: Secondary | ICD-10-CM | POA: Diagnosis not present

## 2019-04-20 DIAGNOSIS — I10 Essential (primary) hypertension: Secondary | ICD-10-CM | POA: Diagnosis not present

## 2019-04-24 ENCOUNTER — Encounter (HOSPITAL_COMMUNITY): Payer: Medicare HMO

## 2019-04-24 ENCOUNTER — Ambulatory Visit (HOSPITAL_COMMUNITY)
Admission: RE | Admit: 2019-04-24 | Discharge: 2019-04-24 | Disposition: A | Discharge status: Home / Self Care | Payer: Medicare HMO | Source: Ambulatory Visit | Attending: Cardiovascular Disease | Admitting: Cardiovascular Disease

## 2019-04-24 ENCOUNTER — Other Ambulatory Visit: Payer: Self-pay

## 2019-04-24 ENCOUNTER — Ambulatory Visit (HOSPITAL_COMMUNITY): Payer: Medicare HMO

## 2019-04-24 DIAGNOSIS — R011 Cardiac murmur, unspecified: Secondary | ICD-10-CM | POA: Insufficient documentation

## 2019-04-24 NOTE — Progress Notes (Signed)
*  PRELIMINARY RESULTS* Echocardiogram 2D Echocardiogram has been performed.  Crystal Fischer 04/24/2019, 3:11 PM

## 2019-04-30 ENCOUNTER — Telehealth: Payer: Self-pay | Admitting: Cardiovascular Disease

## 2019-04-30 DIAGNOSIS — R072 Precordial pain: Secondary | ICD-10-CM

## 2019-04-30 NOTE — Telephone Encounter (Signed)
the case # [id number] for a [id number] and it denied originally on 4/23 and the reconsideration denied on 4/28. For stress test. Will send to Dr. Bronson Ing for further instructions.

## 2019-04-30 NOTE — Telephone Encounter (Signed)
Patient called requested test results of recent echo

## 2019-04-30 NOTE — Telephone Encounter (Signed)
Crystal Fischer, [place]  624THL 579FGE PM EDT    Patient notified. Copy to pcp.   Herminio Commons, MD  04/25/2019 3:42 PM EDT    Normal pumping function. Mild aortic valve narrowing.

## 2019-05-01 ENCOUNTER — Encounter (HOSPITAL_COMMUNITY): Payer: Medicare HMO

## 2019-05-01 ENCOUNTER — Ambulatory Visit (HOSPITAL_COMMUNITY): Payer: Medicare HMO

## 2019-05-01 NOTE — Telephone Encounter (Signed)
For chest pain? Doesn't make sense. Then can try getting a CCTA.

## 2019-05-07 NOTE — Telephone Encounter (Signed)
Left message to return call 

## 2019-05-07 NOTE — Telephone Encounter (Signed)
Discussed insurance issue - she is willing to do Coronary CT if insurance will approve.  Order entered & sent to pcc for scheduling.

## 2019-05-21 ENCOUNTER — Other Ambulatory Visit: Payer: Self-pay | Admitting: *Deleted

## 2019-05-21 DIAGNOSIS — I10 Essential (primary) hypertension: Secondary | ICD-10-CM

## 2019-05-21 DIAGNOSIS — R072 Precordial pain: Secondary | ICD-10-CM

## 2019-05-21 DIAGNOSIS — Z01812 Encounter for preprocedural laboratory examination: Secondary | ICD-10-CM

## 2019-05-22 ENCOUNTER — Other Ambulatory Visit: Payer: Self-pay

## 2019-05-22 ENCOUNTER — Other Ambulatory Visit (HOSPITAL_COMMUNITY)
Admission: RE | Admit: 2019-05-22 | Discharge: 2019-05-22 | Disposition: A | Discharge status: Home / Self Care | Payer: Medicare HMO | Source: Ambulatory Visit | Attending: Cardiovascular Disease | Admitting: Cardiovascular Disease

## 2019-05-22 DIAGNOSIS — Z01812 Encounter for preprocedural laboratory examination: Secondary | ICD-10-CM | POA: Insufficient documentation

## 2019-05-22 DIAGNOSIS — I10 Essential (primary) hypertension: Secondary | ICD-10-CM | POA: Diagnosis not present

## 2019-05-22 LAB — BASIC METABOLIC PANEL
Anion gap: 15 (ref 5–15)
BUN: 15 mg/dL (ref 8–23)
CO2: 23 mmol/L (ref 22–32)
Calcium: 9.1 mg/dL (ref 8.9–10.3)
Chloride: 100 mmol/L (ref 98–111)
Creatinine, Ser: 0.75 mg/dL (ref 0.44–1.00)
GFR calc Af Amer: >60 mL/min (ref >60)
GFR calc non Af Amer: >60 mL/min (ref >60)
Glucose, Bld: 142 mg/dL — ABNORMAL HIGH (ref 70–99)
Potassium: 3.7 mmol/L (ref 3.5–5.1)
Sodium: 138 mmol/L (ref 135–145)

## 2019-05-25 ENCOUNTER — Other Ambulatory Visit (HOSPITAL_COMMUNITY): Payer: Self-pay | Admitting: *Deleted

## 2019-05-25 ENCOUNTER — Telehealth (HOSPITAL_COMMUNITY): Payer: Self-pay | Admitting: *Deleted

## 2019-05-25 MED ORDER — METOPROLOL TARTRATE 100 MG PO TABS: ORAL_TABLET | ORAL | 0 refills | Status: DC

## 2019-05-25 NOTE — Telephone Encounter (Signed)
Attempted to call patient regarding upcoming cardiac CT appointment. Left message on voicemail with name and callback number  Merle Tai RN Navigator Cardiac Imaging [facility] [phone] Office [phone] Cell  

## 2019-05-26 ENCOUNTER — Encounter (HOSPITAL_COMMUNITY): Payer: Self-pay

## 2019-05-26 ENCOUNTER — Encounter: Payer: Medicare HMO | Admitting: *Deleted

## 2019-05-26 ENCOUNTER — Other Ambulatory Visit: Payer: Self-pay

## 2019-05-26 ENCOUNTER — Ambulatory Visit (HOSPITAL_COMMUNITY)
Admission: RE | Admit: 2019-05-26 | Discharge: 2019-05-26 | Disposition: A | Discharge status: Home / Self Care | Payer: Medicare HMO | Source: Ambulatory Visit | Attending: Cardiovascular Disease | Admitting: Cardiovascular Disease

## 2019-05-26 DIAGNOSIS — I700 Atherosclerosis of aorta: Secondary | ICD-10-CM | POA: Diagnosis not present

## 2019-05-26 DIAGNOSIS — R072 Precordial pain: Secondary | ICD-10-CM | POA: Insufficient documentation

## 2019-05-26 DIAGNOSIS — I2510 Atherosclerotic heart disease of native coronary artery without angina pectoris: Secondary | ICD-10-CM | POA: Insufficient documentation

## 2019-05-26 DIAGNOSIS — R079 Chest pain, unspecified: Secondary | ICD-10-CM | POA: Diagnosis not present

## 2019-05-26 DIAGNOSIS — Z006 Encounter for examination for normal comparison and control in clinical research program: Secondary | ICD-10-CM

## 2019-05-26 MED ORDER — NITROGLYCERIN 0.4 MG SL SUBL
0.8000 mg | SUBLINGUAL_TABLET | Freq: Once | SUBLINGUAL | Status: AC
  Administered 2019-05-26: 0.8 mg via SUBLINGUAL

## 2019-05-26 MED ORDER — IOHEXOL 350 MG/ML SOLN
80.0000 mL | Freq: Once | INTRAVENOUS | Status: AC | PRN
  Administered 2019-05-26: 80 mL via INTRAVENOUS

## 2019-05-26 MED ORDER — NITROGLYCERIN 0.4 MG SL SUBL
SUBLINGUAL_TABLET | SUBLINGUAL | Status: AC
  Filled 2019-05-26: qty 2

## 2019-05-26 NOTE — Progress Notes (Signed)
Patient tolerated CT well. Drank water after. Ambulated to exit steady gait.  

## 2019-05-29 NOTE — Research (Signed)
CADFEM Informed Consent                  Subject Name:   Crystal Fischer   Subject met inclusion and exclusion criteria.  The informed consent form, study requirements and expectations were reviewed with the subject and questions and concerns were addressed prior to the signing of the consent form.  The subject verbalized understanding of the trial requirements.  The subject agreed to participate in the CADFEM trial and signed the informed consent.  The informed consent was obtained prior to performance of any protocol-specific procedures for the subject.  A copy of the signed informed consent was given to the subject and a copy was placed in the subject's medical record. This patient was consented by Tiara Woodard   [place] Rachael Ferrie, Research Assistant 05/26/2019 11:05 a.m.

## 2019-06-02 ENCOUNTER — Telehealth: Payer: Self-pay | Admitting: *Deleted

## 2019-06-02 MED ORDER — ATORVASTATIN CALCIUM 20 MG PO TABS: 20.0000 mg | ORAL_TABLET | Freq: Every day | ORAL | 6 refills | Status: DC

## 2019-06-02 MED ORDER — ASPIRIN EC 81 MG PO TBEC: 81.0000 mg | DELAYED_RELEASE_TABLET | Freq: Every day | ORAL | Status: DC

## 2019-06-02 NOTE — Telephone Encounter (Signed)
Laurine Blazer, LPN  [id number] 624THL AM EDT    Notified, copy to pcp.   Herminio Commons, MD  Laurine Blazer, LPN  Blockages are mild. Nothing to warrant cardiac catheterization. Start aspirin 81 mg daily and try to take atorvastatin 20 mg daily.     Herminio Commons, MD  05/27/2019 1:02 PM EDT    She has blockages but they appear to be mild but further studies are being done. Coronary calcium score is high. I would recommend starting aspirin 81 mg daily and if she is able to take atorvastatin 20 mg daily I would encourage her to do so.   Herminio Commons, MD  05/26/2019 1:56 PM EDT    Await cardiac CT results.

## 2019-06-22 DIAGNOSIS — G8929 Other chronic pain: Secondary | ICD-10-CM | POA: Diagnosis not present

## 2019-06-22 DIAGNOSIS — Z791 Long term (current) use of non-steroidal anti-inflammatories (NSAID): Secondary | ICD-10-CM | POA: Diagnosis not present

## 2019-06-22 DIAGNOSIS — I10 Essential (primary) hypertension: Secondary | ICD-10-CM | POA: Diagnosis not present

## 2019-06-22 DIAGNOSIS — E669 Obesity, unspecified: Secondary | ICD-10-CM | POA: Diagnosis not present

## 2019-06-22 DIAGNOSIS — E039 Hypothyroidism, unspecified: Secondary | ICD-10-CM | POA: Diagnosis not present

## 2019-06-22 DIAGNOSIS — E785 Hyperlipidemia, unspecified: Secondary | ICD-10-CM | POA: Diagnosis not present

## 2019-06-22 DIAGNOSIS — Z7982 Long term (current) use of aspirin: Secondary | ICD-10-CM | POA: Diagnosis not present

## 2019-06-22 DIAGNOSIS — Z809 Family history of malignant neoplasm, unspecified: Secondary | ICD-10-CM | POA: Diagnosis not present

## 2019-06-22 DIAGNOSIS — G4700 Insomnia, unspecified: Secondary | ICD-10-CM | POA: Diagnosis not present

## 2019-06-22 DIAGNOSIS — R600 Localized edema: Secondary | ICD-10-CM | POA: Diagnosis not present

## 2019-06-29 DIAGNOSIS — L03314 Cellulitis of groin: Secondary | ICD-10-CM | POA: Diagnosis not present

## 2019-06-29 DIAGNOSIS — R8290 Unspecified abnormal findings in urine: Secondary | ICD-10-CM | POA: Diagnosis not present

## 2019-07-07 DIAGNOSIS — L03119 Cellulitis of unspecified part of limb: Secondary | ICD-10-CM | POA: Diagnosis not present

## 2019-07-07 DIAGNOSIS — Z6836 Body mass index (BMI) 36.0-36.9, adult: Secondary | ICD-10-CM | POA: Diagnosis not present

## 2019-07-07 DIAGNOSIS — L02419 Cutaneous abscess of limb, unspecified: Secondary | ICD-10-CM | POA: Diagnosis not present

## 2019-07-15 DIAGNOSIS — Z6836 Body mass index (BMI) 36.0-36.9, adult: Secondary | ICD-10-CM | POA: Diagnosis not present

## 2019-07-15 DIAGNOSIS — R69 Illness, unspecified: Secondary | ICD-10-CM | POA: Diagnosis not present

## 2019-07-15 DIAGNOSIS — L02419 Cutaneous abscess of limb, unspecified: Secondary | ICD-10-CM | POA: Diagnosis not present

## 2019-07-15 DIAGNOSIS — L03119 Cellulitis of unspecified part of limb: Secondary | ICD-10-CM | POA: Diagnosis not present

## 2019-07-16 ENCOUNTER — Telehealth: Payer: Self-pay | Admitting: *Deleted

## 2019-07-16 DIAGNOSIS — L732 Hidradenitis suppurativa: Secondary | ICD-10-CM | POA: Diagnosis not present

## 2019-07-16 DIAGNOSIS — E038 Other specified hypothyroidism: Secondary | ICD-10-CM | POA: Diagnosis not present

## 2019-07-16 DIAGNOSIS — E782 Mixed hyperlipidemia: Secondary | ICD-10-CM | POA: Diagnosis not present

## 2019-07-16 DIAGNOSIS — R7301 Impaired fasting glucose: Secondary | ICD-10-CM | POA: Diagnosis not present

## 2019-07-16 DIAGNOSIS — R5383 Other fatigue: Secondary | ICD-10-CM | POA: Diagnosis not present

## 2019-07-16 DIAGNOSIS — I10 Essential (primary) hypertension: Secondary | ICD-10-CM | POA: Diagnosis not present

## 2019-07-16 DIAGNOSIS — E7800 Pure hypercholesterolemia, unspecified: Secondary | ICD-10-CM | POA: Diagnosis not present

## 2019-07-16 NOTE — Telephone Encounter (Signed)
   Primary Cardiologist: Rozann Lesches, MD  Chart reviewed as part of pre-operative protocol coverage. Patient was contacted 07/16/2019 in reference to pre-operative risk assessment for pending surgery as outlined below.  Crystal Fischer was last seen on 04/14/19 by Dr. Carylon Perches.  Since that day, Crystal Fischer has done she has undergone an echocardiogram which showed a hyperdynamic LV function and a coronary CTA which revealed mild non-obstructive disease. She has recently started back exercising. She has chronic DOE which is unchanged and can easily complete 4 METs without anginal complaints.  Therefore, based on ACC/AHA guidelines, the patient would be at acceptable risk for the planned procedure without further cardiovascular testing.   I will route this recommendation to the requesting party via Epic fax function and remove from pre-op pool. Please call with questions.  Abigail Butts, PA-C 07/16/2019, 4:39 PM

## 2019-07-16 NOTE — Telephone Encounter (Signed)
   [facility] Pre-operative Risk Assessment    HEARTCARE STAFF: - Please ensure there is not already an duplicate clearance open for this procedure. - Under Visit Info/Reason for Call, type in Other and utilize the format Clearance MM/DD/YY or Clearance TBD. Do not use dashes or single digits. - If request is for dental extraction, please clarify the # of teeth to be extracted.  Request for surgical clearance:  1. What type of surgery is being performed? Excision of hydradenitis right inguinal crease  2. When is this surgery scheduled? 07/24/2019  3. What type of clearance is required (medical clearance vs. Pharmacy clearance to hold med vs. Both)? Cardiac Clearance  4. Are there any medications that need to be held prior to surgery and how long? no   5. Practice name and name of physician performing surgery? Dr. Adelina Mings  6. What is the office phone number? [phone]   7.   What is the office fax number? [phone]  8.   Anesthesia type (None, local, MAC, general) ? general   Crystal Fischer 07/16/2019, 3:53 PM  _________________________________________________________________   (provider comments below)

## 2019-07-21 DIAGNOSIS — R7303 Prediabetes: Secondary | ICD-10-CM | POA: Diagnosis not present

## 2019-07-21 DIAGNOSIS — R7301 Impaired fasting glucose: Secondary | ICD-10-CM | POA: Diagnosis not present

## 2019-07-21 DIAGNOSIS — L732 Hidradenitis suppurativa: Secondary | ICD-10-CM | POA: Diagnosis not present

## 2019-07-21 DIAGNOSIS — E782 Mixed hyperlipidemia: Secondary | ICD-10-CM | POA: Diagnosis not present

## 2019-07-21 DIAGNOSIS — R5383 Other fatigue: Secondary | ICD-10-CM | POA: Diagnosis not present

## 2019-07-21 DIAGNOSIS — I10 Essential (primary) hypertension: Secondary | ICD-10-CM | POA: Diagnosis not present

## 2019-07-21 DIAGNOSIS — G5603 Carpal tunnel syndrome, bilateral upper limbs: Secondary | ICD-10-CM | POA: Diagnosis not present

## 2019-07-21 DIAGNOSIS — Z6836 Body mass index (BMI) 36.0-36.9, adult: Secondary | ICD-10-CM | POA: Diagnosis not present

## 2019-07-22 ENCOUNTER — Encounter: Payer: Self-pay | Admitting: *Deleted

## 2019-07-22 ENCOUNTER — Encounter: Payer: Self-pay | Admitting: Cardiology

## 2019-07-22 ENCOUNTER — Ambulatory Visit (INDEPENDENT_AMBULATORY_CARE_PROVIDER_SITE_OTHER): Payer: Medicare HMO | Admitting: Cardiology

## 2019-07-22 VITALS — BP 116/70 | HR 71 | Ht 63.0 in | Wt 210.2 lb

## 2019-07-22 DIAGNOSIS — Z01818 Encounter for other preprocedural examination: Secondary | ICD-10-CM | POA: Diagnosis not present

## 2019-07-22 DIAGNOSIS — I350 Nonrheumatic aortic (valve) stenosis: Secondary | ICD-10-CM

## 2019-07-22 DIAGNOSIS — E782 Mixed hyperlipidemia: Secondary | ICD-10-CM | POA: Diagnosis not present

## 2019-07-22 DIAGNOSIS — I2510 Atherosclerotic heart disease of native coronary artery without angina pectoris: Secondary | ICD-10-CM | POA: Diagnosis not present

## 2019-07-22 DIAGNOSIS — L732 Hidradenitis suppurativa: Secondary | ICD-10-CM | POA: Diagnosis not present

## 2019-07-22 NOTE — Progress Notes (Signed)
Cardiology Office Note  Date: 07/22/2019   ID: Crystal, Fischer 04-23-1955, MRN [id number]  PCP:  Manon Hilding, MD  Cardiologist:  Rozann Lesches, MD Electrophysiologist:  None   Chief Complaint  Patient presents with  . Cardiac follow-up    History of Present Illness: Crystal Fischer is a 64 y.o. female former patient of Dr. Bronson Ing last seen in April.  She presents now to establish follow-up with me.  I reviewed extensive records and updated the chart.  She underwent a cardiac CTA in May with results as detailed below.  Calcium score was elevated at 1046 and she did have calcified plaque of mild to moderate range predominantly in the proximal LAD with FFR analysis not showing any hemodynamic the significant stenosis.  She does not report any recurring chest tightness at this point, generally doing well.  We went over her medications which are outlined below.  She has continued on aspirin, Lipitor, and lisinopril.  I am requesting her most recent lipid panel from Dr. Quintin Alto.   Past Medical History:  Diagnosis Date  . Coronary atherosclerosis    Nonobstructive by cardiac CTA with FFR May 2021  . Essential hypertension   . Hypothyroidism   . Mixed hyperlipidemia     Past Surgical History:  Procedure Laterality Date  . APPENDECTOMY  1971  . CHOLECYSTECTOMY  2006  . FOOT SURGERY  06/06/05   bunion  . TONSILLECTOMY AND ADENOIDECTOMY    . TUBAL LIGATION  1982  . URETHRA SURGERY  06-2003   urethral suspension    Current Outpatient Medications  Medication Sig Dispense Refill  . aspirin EC 81 MG tablet Take 1 tablet (81 mg total) by mouth daily.    Marland Kitchen atorvastatin (LIPITOR) 20 MG tablet Take 1 tablet (20 mg total) by mouth daily. 30 tablet 6  . furosemide (LASIX) 40 MG tablet Take 40 mg by mouth daily.    Marland Kitchen levothyroxine (SYNTHROID) 200 MCG tablet Take 200 mcg by mouth every other day.    . levothyroxine (SYNTHROID, LEVOTHROID) 175 MCG tablet Take 175 mcg by mouth every  other day.     . lisinopril (PRINIVIL,ZESTRIL) 10 MG tablet Take 10 mg by mouth daily.    . Magnesium 250 MG TABS Take 250 mg by mouth.    . TURMERIC PO Take 1 tablet by mouth daily.    Marland Kitchen zolpidem (AMBIEN) 10 MG tablet Take 1 tablet by mouth at bedtime as needed.  3   No current facility-administered medications for this visit.   Allergies:  Codeine   Social History: The patient  reports that she has quit smoking. Her smoking use included cigarettes. She quit after 20.00 years of use. She has never used smokeless tobacco. She reports current alcohol use.   Family History: The patient's family history includes Cervical cancer in her mother; Heart attack in her father.   ROS:  Hidradenitis, has surgical removal scheduled for later this week.  Physical Exam: VS:  BP 116/70   Pulse 71   Ht 5\' 3"  (1.6 m)   Wt 210 lb 3.2 oz (95.3 kg)   SpO2 95%   BMI 37.24 kg/m , BMI Body mass index is 37.24 kg/m.  Wt Readings from Last 3 Encounters:  07/22/19 210 lb 3.2 oz (95.3 kg)  04/14/19 211 lb (95.7 kg)  05/03/15 204 lb (92.5 kg)    General: Patient appears comfortable at rest. HEENT: Conjunctiva and lids normal, wearing a mask. Neck: Supple, no  elevated JVP, no thyromegaly. Lungs: Clear to auscultation, nonlabored breathing at rest. Cardiac: Regular rate and rhythm, no S3, 1-6/1 systolic murmur, no pericardial rub. Extremities: No pitting edema, distal pulses 2+.  ECG:  An ECG dated March 2021 was personally reviewed today and demonstrated:  Normal sinus rhythm.  Recent Labwork: 05/22/2019: BUN 15; Creatinine, Ser 0.75; Potassium 3.7; Sodium 138   Other Studies Reviewed Today:  Cardiac CTA 05/26/2019: FINDINGS: Image quality: excellent.  Noise artifact is: Limited.  Coronary Arteries:  Normal coronary origin.  Right dominance.  Left main: The left main is a large caliber vessel with a normal take off from the left coronary cusp that bifurcates to form a left anterior  descending artery and a left circumflex artery. There is minimal calcified plaque (<25%).  Left anterior descending artery: The proximal LAD contains heavily calcified mid (25-49%). The mid and distal LAD contains minimal calcified plaque (<25%). The LAD gives off 2 patent diagonal branches.  Ramus intermedius: Patent with no evidence of plaque or stenosis.  Left circumflex artery: The LCX is non-dominant and contains minimal calcified plaque (<25%). This vessel is small and hypoplastic.  Right coronary artery: The RCA is super dominant with normal take off from the right coronary cusp. There is diffusely calcified plaque that is minimal (<25%). The RCA terminates as a PDA and right posterolateral branch with minimal calcified plaque (<25%).  Right Atrium: Right atrial size is within normal limits.  Right Ventricle: The right ventricular cavity is within normal limits.  Left Atrium: Left atrial size is normal in size with no left atrial appendage filling defect.  Left Ventricle: The ventricular cavity size is within normal limits. There are no stigmata of prior infarction. There is no abnormal filling defect.  Pulmonary arteries: Normal in size without proximal filling defect.  Pulmonary veins: Normal pulmonary venous drainage.  Pericardium: Normal thickness with no significant effusion or calcium present.  Cardiac valves: The aortic valve is trileaflet with minimal calcification. The mitral valve is normal structure with mild mitral annular calcification.  Aorta: Normal caliber with no significant disease.  Extra-cardiac findings: See attached radiology report for non-cardiac structures.  IMPRESSION: 1. Coronary calcium score of 1046. This was 99th percentile for age and sex matched controls.  2. Normal coronary origin with super dominant RCA.  3. Mild calcified plaque (25-49%) in the proximal LAD. Heavy blooming artifact noted.  4. Minimal  calcified plaque in the LCX/RCA (<25%).  5. Mild mitral annular calcification.  RECOMMENDATIONS: 1. Mild non-obstructive CAD (25-49%) in the proximal LAD. Due to heavy blooming artifact, will send CT FFR to ensure no obstructive disease.  Cardiac CT FFR 05/26/2019: FINDINGS: FFRct analysis was performed on the original cardiac CT angiogram dataset. Diagrammatic representation of the FFRct analysis is provided in a separate PDF document in PACS. This dictation was created using the PDF document and an interactive 3D model of the results. 3D model is not available in the EMR/PACS. Normal FFR range is >0.80.  1. Left Main: 0.97; no significant stenosis.  2. LAD: 0.92; no significant stenosis. 3. LCX: 0.95; no significant stenosis. 4. RCA: 0.91; no significant stenosis.  IMPRESSION: 1.  CT FFR analysis didn't show any significant stenosis.  Echocardiogram 04/24/2019: 1. Left ventricular ejection fraction, by estimation, is 70 to 75%. The  left ventricle has hyperdynamic function. The left ventricle has no  regional wall motion abnormalities. Left ventricular diastolic parameters  are consistent with Grade I diastolic  dysfunction (impaired relaxation).  2. Right ventricular  systolic function is normal. The right ventricular  size is normal. There is normal pulmonary artery systolic pressure.  3. The mitral valve is normal in structure. No evidence of mitral valve  regurgitation. No evidence of mitral stenosis.  4. The aortic valve has an indeterminant number of cusps. Aortic valve  regurgitation is not visualized. Mild aortic valve stenosis. Aortic valve  mean gradient measures 11.0 mmHg. Aortic valve peak gradient measures 21.8  mmHg. Aortic valve area, by VTI  measures 1.95 cm.  5. The inferior vena cava is normal in size with greater than 50%  respiratory variability, suggesting right atrial pressure of 3 mmHg.   Assessment and Plan:  1.  Nonobstructive,  calcified coronary atherosclerosis by cardiac CTA with FFR imaging in May as outlined above.  Plan is observation and medical therapy for risk factor reduction.  Continue aspirin, Lipitor, and lisinopril.  2.  Mild calcific aortic stenosis by echocardiogram in April, cardiac murmur consistent with this.  Asymptomatic at this point.  3.  Mixed hyperlipidemia, on Lipitor.  Requesting recent lipid panel from PCP.  Ideally LDL should be under 70.  Medication Adjustments/Labs and Tests Ordered: Current medicines are reviewed at length with the patient today.  Concerns regarding medicines are outlined above.   Tests Ordered: No orders of the defined types were placed in this encounter.   Medication Changes: No orders of the defined types were placed in this encounter.   Disposition:  Follow up 1 year in the [place] office.  Signed, Satira Sark, MD, [facility] 07/22/2019 2:52 PM    [facility], [place], [place] [address] Phone: [phone]; Fax: [phone]

## 2019-07-22 NOTE — Patient Instructions (Addendum)

## 2019-07-24 DIAGNOSIS — Z7982 Long term (current) use of aspirin: Secondary | ICD-10-CM | POA: Diagnosis not present

## 2019-07-24 DIAGNOSIS — Z79890 Hormone replacement therapy (postmenopausal): Secondary | ICD-10-CM | POA: Diagnosis not present

## 2019-07-24 DIAGNOSIS — Z79899 Other long term (current) drug therapy: Secondary | ICD-10-CM | POA: Diagnosis not present

## 2019-07-24 DIAGNOSIS — I10 Essential (primary) hypertension: Secondary | ICD-10-CM | POA: Diagnosis not present

## 2019-07-24 DIAGNOSIS — E039 Hypothyroidism, unspecified: Secondary | ICD-10-CM | POA: Diagnosis not present

## 2019-07-24 DIAGNOSIS — Z9049 Acquired absence of other specified parts of digestive tract: Secondary | ICD-10-CM | POA: Diagnosis not present

## 2019-07-24 DIAGNOSIS — L732 Hidradenitis suppurativa: Secondary | ICD-10-CM | POA: Diagnosis not present

## 2019-07-24 DIAGNOSIS — Z885 Allergy status to narcotic agent status: Secondary | ICD-10-CM | POA: Diagnosis not present

## 2019-07-29 ENCOUNTER — Telehealth: Payer: PRIVATE HEALTH INSURANCE | Admitting: Cardiovascular Disease

## 2019-10-01 DIAGNOSIS — Z6835 Body mass index (BMI) 35.0-35.9, adult: Secondary | ICD-10-CM | POA: Diagnosis not present

## 2019-10-01 DIAGNOSIS — M25512 Pain in left shoulder: Secondary | ICD-10-CM | POA: Diagnosis not present

## 2019-10-05 ENCOUNTER — Other Ambulatory Visit: Payer: Self-pay | Admitting: *Deleted

## 2019-10-05 MED ORDER — ATORVASTATIN CALCIUM 20 MG PO TABS: 20.0000 mg | ORAL_TABLET | Freq: Every day | ORAL | 6 refills | Status: DC

## 2019-10-09 DIAGNOSIS — R69 Illness, unspecified: Secondary | ICD-10-CM | POA: Diagnosis not present

## 2019-11-17 DIAGNOSIS — R7301 Impaired fasting glucose: Secondary | ICD-10-CM | POA: Diagnosis not present

## 2019-11-17 DIAGNOSIS — I10 Essential (primary) hypertension: Secondary | ICD-10-CM | POA: Diagnosis not present

## 2019-11-17 DIAGNOSIS — Z0001 Encounter for general adult medical examination with abnormal findings: Secondary | ICD-10-CM | POA: Diagnosis not present

## 2019-11-17 DIAGNOSIS — E782 Mixed hyperlipidemia: Secondary | ICD-10-CM | POA: Diagnosis not present

## 2019-11-17 DIAGNOSIS — R5383 Other fatigue: Secondary | ICD-10-CM | POA: Diagnosis not present

## 2019-11-17 DIAGNOSIS — E7800 Pure hypercholesterolemia, unspecified: Secondary | ICD-10-CM | POA: Diagnosis not present

## 2019-11-17 DIAGNOSIS — E038 Other specified hypothyroidism: Secondary | ICD-10-CM | POA: Diagnosis not present

## 2019-11-21 DIAGNOSIS — Z6833 Body mass index (BMI) 33.0-33.9, adult: Secondary | ICD-10-CM | POA: Diagnosis not present

## 2019-11-21 DIAGNOSIS — R7303 Prediabetes: Secondary | ICD-10-CM | POA: Diagnosis not present

## 2019-11-21 DIAGNOSIS — L732 Hidradenitis suppurativa: Secondary | ICD-10-CM | POA: Diagnosis not present

## 2019-11-21 DIAGNOSIS — E782 Mixed hyperlipidemia: Secondary | ICD-10-CM | POA: Diagnosis not present

## 2019-11-21 DIAGNOSIS — M542 Cervicalgia: Secondary | ICD-10-CM | POA: Diagnosis not present

## 2019-11-21 DIAGNOSIS — M25512 Pain in left shoulder: Secondary | ICD-10-CM | POA: Diagnosis not present

## 2019-11-21 DIAGNOSIS — I10 Essential (primary) hypertension: Secondary | ICD-10-CM | POA: Diagnosis not present

## 2019-11-21 DIAGNOSIS — Z0001 Encounter for general adult medical examination with abnormal findings: Secondary | ICD-10-CM | POA: Diagnosis not present

## 2019-11-24 NOTE — Progress Notes (Signed)
Cardiology Office Note  Date: 11/25/2019   ID: Enya, Bureau [birth year], MRN [id number]  PCP:  Manon Hilding, MD  Cardiologist:  Rozann Lesches, MD Electrophysiologist:  None   Chief Complaint  Patient presents with  . Cardiac follow-up    History of Present Illness: Crystal Fischer is a 64 y.o. female that I met back in July.  She presents to the office reporting new symptomatology.  She states that over the last week or so she has felt more fatigued, like something is "just not right."  She has had reflux symptoms which is unusual, also pain between her shoulder blades.  Still trying to exercise by walking and also water aerobics to keep her weight down.  She saw Dr. Quintin Alto with ECG which I reviewed, overall nonspecific.  She states that he told her that her heart rate was irregular, she was in sinus rhythm by the tracing.  She does report occasional brief palpitations perhaps once or twice a month monthly.  No syncope.  I personally reviewed her ECG today which shows normal sinus rhythm with nonspecific ST-T changes and poor R wave progression.  Cardiac CTA from May of this year as noted below, she has been managed medically so far although in the absence of symptoms.  I reviewed her current medications which are outlined below.  Past Medical History:  Diagnosis Date  . Coronary atherosclerosis    Nonobstructive by cardiac CTA with FFR May 2021  . Essential hypertension   . Hypothyroidism   . Mixed hyperlipidemia     Past Surgical History:  Procedure Laterality Date  . APPENDECTOMY  1971  . CHOLECYSTECTOMY  2006  . FOOT SURGERY  06/06/05   bunion  . TONSILLECTOMY AND ADENOIDECTOMY    . TUBAL LIGATION  1982  . URETHRA SURGERY  06-2003   urethral suspension    Current Outpatient Medications  Medication Sig Dispense Refill  . aspirin EC 81 MG tablet Take 1 tablet (81 mg total) by mouth daily.    Marland Kitchen atorvastatin (LIPITOR) 20 MG tablet Take 1 tablet (20 mg total) by  mouth daily. 30 tablet 6  . furosemide (LASIX) 40 MG tablet Take 40 mg by mouth daily.    Marland Kitchen levothyroxine (SYNTHROID) 200 MCG tablet Take 200 mcg by mouth every other day.    . levothyroxine (SYNTHROID, LEVOTHROID) 175 MCG tablet Take 175 mcg by mouth every other day.     . lisinopril (PRINIVIL,ZESTRIL) 10 MG tablet Take 10 mg by mouth daily.    . Magnesium 250 MG TABS Take 250 mg by mouth.    . TURMERIC PO Take 1 tablet by mouth daily.    Marland Kitchen zolpidem (AMBIEN) 10 MG tablet Take 1 tablet by mouth at bedtime as needed.  3   No current facility-administered medications for this visit.   Allergies:  Codeine   ROS: No syncope.  Physical Exam: VS:  BP 124/76   Ht $R'5\' 3"'[place]$  (1.6 m)   Wt 186 lb (84.4 kg)   SpO2 98%   BMI 32.95 kg/m , BMI Body mass index is 32.95 kg/m.  Wt Readings from Last 3 Encounters:  11/25/19 186 lb (84.4 kg)  07/22/19 210 lb 3.2 oz (95.3 kg)  04/14/19 211 lb (95.7 kg)    General: Patient appears comfortable at rest. HEENT: Conjunctiva and lids normal, wearing a mask. Neck: Supple, no elevated JVP or carotid bruits, no thyromegaly. Lungs: Clear to auscultation, nonlabored breathing at rest. Cardiac: Regular  rate and rhythm, no S3 or significant systolic murmur, no pericardial rub. Extremities: No pitting edema, distal pulses 2+.  ECG:  An ECG dated March 2021 was personally reviewed today and demonstrated:  Normal sinus rhythm.  Recent Labwork: 05/22/2019: BUN 15; Creatinine, Ser 0.75; Potassium 3.7; Sodium 138  09/21/2019: BUN 13, creatinine 0.75, potassium 4.5, AST 18, ALT 23, cholesterol 166, triglycerides 163, HDL 47, LDL 91, TSH 3.62, hemoglobin A1c 6.3%  Other Studies Reviewed Today:  Cardiac CTA 05/26/2019: FINDINGS: Image quality: excellent.  Noise artifact is: Limited.  Coronary Arteries: Normal coronary origin. Right dominance.  Left main: The left main is a large caliber vessel with a normal take off from the left coronary cusp that  bifurcates to form a left anterior descending artery and a left circumflex artery. There is minimal calcified plaque (<25%).  Left anterior descending artery: The proximal LAD contains heavily calcified mid (25-49%). The mid and distal LAD contains minimal calcified plaque (<25%). The LAD gives off 2 patent diagonal branches.  Ramus intermedius: Patent with no evidence of plaque or stenosis.  Left circumflex artery: The LCX is non-dominant and contains minimal calcified plaque (<25%). This vessel is small and hypoplastic.  Right coronary artery: The RCA is super dominant with normal take off from the right coronary cusp. There is diffusely calcified plaque that is minimal (<25%). The RCA terminates as a PDA and right posterolateral branch with minimal calcified plaque (<25%).  Right Atrium: Right atrial size is within normal limits.  Right Ventricle: The right ventricular cavity is within normal limits.  Left Atrium: Left atrial size is normal in size with no left atrial appendage filling defect.  Left Ventricle: The ventricular cavity size is within normal limits. There are no stigmata of prior infarction. There is no abnormal filling defect.  Pulmonary arteries: Normal in size without proximal filling defect.  Pulmonary veins: Normal pulmonary venous drainage.  Pericardium: Normal thickness with no significant effusion or calcium present.  Cardiac valves: The aortic valve is trileaflet with minimal calcification. The mitral valve is normal structure with mild mitral annular calcification.  Aorta: Normal caliber with no significant disease.  Extra-cardiac findings: See attached radiology report for non-cardiac structures.  IMPRESSION: 1. Coronary calcium score of 1046. This was 99th percentile for age and sex matched controls.  2. Normal coronary origin with super dominant RCA.  3. Mild calcified plaque (25-49%) in the proximal LAD.  Heavy blooming artifact noted.  4. Minimal calcified plaque in the LCX/RCA (<25%).  5. Mild mitral annular calcification.  RECOMMENDATIONS: 1. Mild non-obstructive CAD (25-49%) in the proximal LAD. Due to heavy blooming artifact, will send CT FFR to ensure no obstructive disease.  Cardiac CT FFR 05/26/2019: FINDINGS: FFRct analysis was performed on the original cardiac CT angiogram dataset. Diagrammatic representation of the FFRct analysis is provided in a separate PDF document in PACS. This dictation was created using the PDF document and an interactive 3D model of the results. 3D model is not available in the EMR/PACS. Normal FFR range is >0.80.  1. Left Main: 0.97; no significant stenosis.  2. LAD: 0.92; no significant stenosis. 3. LCX: 0.95; no significant stenosis. 4. RCA: 0.91; no significant stenosis.  IMPRESSION: 1. CT FFR analysis didn't show any significant stenosis.  Echocardiogram 04/24/2019: 1. Left ventricular ejection fraction, by estimation, is 70 to 75%. The  left ventricle has hyperdynamic function. The left ventricle has no  regional wall motion abnormalities. Left ventricular diastolic parameters  are consistent with Grade I diastolic  dysfunction (impaired relaxation).  2. Right ventricular systolic function is normal. The right ventricular  size is normal. There is normal pulmonary artery systolic pressure.  3. The mitral valve is normal in structure. No evidence of mitral valve  regurgitation. No evidence of mitral stenosis.  4. The aortic valve has an indeterminant number of cusps. Aortic valve  regurgitation is not visualized. Mild aortic valve stenosis. Aortic valve  mean gradient measures 11.0 mmHg. Aortic valve peak gradient measures 21.8  mmHg. Aortic valve area, by VTI  measures 1.95 cm.  5. The inferior vena cava is normal in size with greater than 50%  respiratory variability, suggesting right atrial pressure of 3 mmHg.    Assessment and Plan:  1.  Coronary artery disease as outlined above, mild to moderate and calcified within the proximal LAD but with negative CT FFR back in May.  She has been managed medically on aspirin, Lipitor, and lisinopril.  She reports recent onset symptoms as discussed above, fatigue, reflux/vague precordial discomfort and discomfort between her shoulder blades.  ECG remains nonspecific with decreased R wave progression.  In light of new onset symptoms we will obtain formal ischemic testing via exercise Myoview on medical therapy.  2.  Mild calcific aortic stenosis by echocardiogram in April, unlikely to be causing any symptoms at this time.  3.  Mixed hyperlipidemia on Lipitor.  Medication Adjustments/Labs and Tests Ordered: Current medicines are reviewed at length with the patient today.  Concerns regarding medicines are outlined above.   Tests Ordered: Orders Placed This Encounter  Procedures  . NM Myocar Multi W/Spect W/Wall Motion / EF  . EKG 12-Lead    Medication Changes: No orders of the defined types were placed in this encounter.   Disposition:  Follow up test results.  Signed, Satira Sark, MD, [facility] 11/25/2019 2:15 PM    [facility] [address] [place], [place] [address] Phone: [phone]; Fax: [phone]

## 2019-11-25 ENCOUNTER — Ambulatory Visit (INDEPENDENT_AMBULATORY_CARE_PROVIDER_SITE_OTHER): Payer: Medicare HMO | Admitting: Cardiology

## 2019-11-25 ENCOUNTER — Other Ambulatory Visit: Payer: Self-pay

## 2019-11-25 ENCOUNTER — Encounter: Payer: Self-pay | Admitting: Cardiology

## 2019-11-25 VITALS — BP 124/76 | Ht 63.0 in | Wt 186.0 lb

## 2019-11-25 DIAGNOSIS — R9431 Abnormal electrocardiogram [ECG] [EKG]: Secondary | ICD-10-CM | POA: Diagnosis not present

## 2019-11-25 DIAGNOSIS — R072 Precordial pain: Secondary | ICD-10-CM

## 2019-11-25 DIAGNOSIS — I2510 Atherosclerotic heart disease of native coronary artery without angina pectoris: Secondary | ICD-10-CM

## 2019-11-25 DIAGNOSIS — I350 Nonrheumatic aortic (valve) stenosis: Secondary | ICD-10-CM

## 2019-11-25 NOTE — Patient Instructions (Signed)
Medication Instructions:  Your physician recommends that you continue on your current medications as directed. Please refer to the Current Medication list given to you today.  *If you need a refill on your cardiac medications before your next appointment, please call your pharmacy*   Lab Work: None today If you have labs (blood work) drawn today and your tests are completely normal, you will receive your results only by: Marland Kitchen MyChart Message (if you have MyChart) OR . A paper copy in the mail If you have any lab test that is abnormal or we need to change your treatment, we will call you to review the results.   Testing/Procedures: Your physician has requested that you have en exercise stress myoview. For further information please visit [email or web address]. Please follow instruction sheet, as given.    Follow-Up: At [facility], you and your health needs are our priority.  As part of our continuing mission to provide you with exceptional heart care, we have created designated Provider Care Teams.  These Care Teams include your primary Cardiologist (physician) and Advanced Practice Providers (APPs -  Physician Assistants and Nurse Practitioners) who all work together to provide you with the care you need, when you need it.  We recommend signing up for the patient portal called "MyChart".  Sign up information is provided on this After Visit Summary.  MyChart is used to connect with patients for Virtual Visits (Telemedicine).  Patients are able to view lab/test results, encounter notes, upcoming appointments, etc.  Non-urgent messages can be sent to your provider as well.   To learn more about what you can do with MyChart, go to [email or web address].    Your next appointment:  We will call you with results.     Thank you for choosing [facility] !

## 2019-12-04 ENCOUNTER — Other Ambulatory Visit: Payer: Self-pay

## 2019-12-04 ENCOUNTER — Other Ambulatory Visit (HOSPITAL_COMMUNITY)
Admission: RE | Admit: 2019-12-04 | Discharge: 2019-12-04 | Disposition: A | Discharge status: Home / Self Care | Payer: Medicare HMO | Source: Ambulatory Visit | Attending: Cardiology | Admitting: Cardiology

## 2019-12-04 DIAGNOSIS — Z01812 Encounter for preprocedural laboratory examination: Secondary | ICD-10-CM | POA: Diagnosis not present

## 2019-12-04 DIAGNOSIS — Z20822 Contact with and (suspected) exposure to covid-19: Secondary | ICD-10-CM | POA: Diagnosis not present

## 2019-12-05 LAB — SARS CORONAVIRUS 2 (TAT 6-24 HRS): SARS Coronavirus 2: NEGATIVE

## 2019-12-08 ENCOUNTER — Other Ambulatory Visit: Payer: Self-pay

## 2019-12-08 ENCOUNTER — Encounter (HOSPITAL_COMMUNITY)
Admission: RE | Admit: 2019-12-08 | Discharge: 2019-12-08 | Disposition: A | Discharge status: Home / Self Care | Payer: Medicare HMO | Source: Ambulatory Visit | Attending: Cardiology | Admitting: Cardiology

## 2019-12-08 ENCOUNTER — Encounter (HOSPITAL_COMMUNITY): Payer: Self-pay

## 2019-12-08 ENCOUNTER — Encounter (HOSPITAL_BASED_OUTPATIENT_CLINIC_OR_DEPARTMENT_OTHER)
Admission: RE | Admit: 2019-12-08 | Discharge: 2019-12-08 | Disposition: A | Discharge status: Home / Self Care | Payer: Medicare HMO | Source: Ambulatory Visit | Attending: Cardiology | Admitting: Cardiology

## 2019-12-08 DIAGNOSIS — R9431 Abnormal electrocardiogram [ECG] [EKG]: Secondary | ICD-10-CM

## 2019-12-08 LAB — NM MYOCAR MULTI W/SPECT W/WALL MOTION / EF
Estimated workload: 8.9 METS
Exercise duration (min): 7 min
Exercise duration (sec): 20 s
LV dias vol: 44 mL (ref 46–106)
LV sys vol: 8 mL
MPHR: 155 {beats}/min
Peak HR: 136 {beats}/min
Percent HR: 87 %
RATE: 0.37
RPE: 15
Rest HR: 74 {beats}/min
SDS: 0
SRS: 0
SSS: 0
TID: 0.89

## 2019-12-08 MED ORDER — SODIUM CHLORIDE FLUSH 0.9 % IV SOLN
INTRAVENOUS | Status: AC
  Administered 2019-12-08: 10 mL via INTRAVENOUS
  Filled 2019-12-08: qty 10

## 2019-12-08 MED ORDER — REGADENOSON 0.4 MG/5ML IV SOLN
INTRAVENOUS | Status: AC
  Filled 2019-12-08: qty 5

## 2019-12-08 MED ORDER — TECHNETIUM TC 99M TETROFOSMIN IV KIT
30.0000 | PACK | Freq: Once | INTRAVENOUS | Status: AC | PRN
  Administered 2019-12-08: 32 via INTRAVENOUS

## 2019-12-08 MED ORDER — TECHNETIUM TC 99M TETROFOSMIN IV KIT
10.0000 | PACK | Freq: Once | INTRAVENOUS | Status: AC | PRN
  Administered 2019-12-08: 11 via INTRAVENOUS

## 2019-12-14 DIAGNOSIS — Z6833 Body mass index (BMI) 33.0-33.9, adult: Secondary | ICD-10-CM | POA: Diagnosis not present

## 2019-12-14 DIAGNOSIS — R300 Dysuria: Secondary | ICD-10-CM | POA: Diagnosis not present

## 2020-01-15 DIAGNOSIS — M542 Cervicalgia: Secondary | ICD-10-CM | POA: Diagnosis not present

## 2020-01-15 DIAGNOSIS — M25512 Pain in left shoulder: Secondary | ICD-10-CM | POA: Diagnosis not present

## 2020-01-21 DIAGNOSIS — M542 Cervicalgia: Secondary | ICD-10-CM | POA: Diagnosis not present

## 2020-01-21 DIAGNOSIS — M7542 Impingement syndrome of left shoulder: Secondary | ICD-10-CM | POA: Diagnosis not present

## 2020-01-25 DIAGNOSIS — M7542 Impingement syndrome of left shoulder: Secondary | ICD-10-CM | POA: Diagnosis not present

## 2020-01-25 DIAGNOSIS — M542 Cervicalgia: Secondary | ICD-10-CM | POA: Diagnosis not present

## 2020-01-26 ENCOUNTER — Ambulatory Visit (INDEPENDENT_AMBULATORY_CARE_PROVIDER_SITE_OTHER): Payer: Medicare HMO | Admitting: Dermatology

## 2020-01-26 ENCOUNTER — Other Ambulatory Visit: Payer: Self-pay

## 2020-01-26 ENCOUNTER — Encounter: Payer: Self-pay | Admitting: Dermatology

## 2020-01-26 DIAGNOSIS — L820 Inflamed seborrheic keratosis: Secondary | ICD-10-CM

## 2020-01-26 DIAGNOSIS — Z1283 Encounter for screening for malignant neoplasm of skin: Secondary | ICD-10-CM | POA: Diagnosis not present

## 2020-01-26 DIAGNOSIS — D485 Neoplasm of uncertain behavior of skin: Secondary | ICD-10-CM

## 2020-01-26 DIAGNOSIS — Z8589 Personal history of malignant neoplasm of other organs and systems: Secondary | ICD-10-CM

## 2020-01-26 DIAGNOSIS — Z85828 Personal history of other malignant neoplasm of skin: Secondary | ICD-10-CM | POA: Diagnosis not present

## 2020-01-26 NOTE — Patient Instructions (Signed)

## 2020-01-29 DIAGNOSIS — M25512 Pain in left shoulder: Secondary | ICD-10-CM | POA: Diagnosis not present

## 2020-01-29 DIAGNOSIS — M542 Cervicalgia: Secondary | ICD-10-CM | POA: Diagnosis not present

## 2020-01-30 ENCOUNTER — Encounter: Payer: Self-pay | Admitting: Dermatology

## 2020-01-31 NOTE — Progress Notes (Signed)
   Follow-Up Visit   Subjective  Crystal Fischer is a 65 y.o. female who presents for the following: Skin Problem (PLACE ON HER RIGHT BACK SHE PEELED THE TOP OF X MONTHS PRESENT STINGS IN THE SHOWER).  General skin check Location: New spot right shoulder blade Duration:  Quality:  Associated Signs/Symptoms: Modifying Factors:  Severity:  Timing: Context:   Objective  Well appearing patient in no apparent distress; mood and affect are within normal limits. Objective  Left Upper Arm - Anterior: No sign residual.  Objective  Left Breast: All sun exposed areas plus upper chest and back examined.  No atypical moles or melanoma.  Objective  Right Upper Back: 7 mm inflamed crust, CIS versus I SK.        All sun exposed areas plus back examined.  Plus chest.   Assessment & Plan    History of squamous cell carcinoma Left Upper Arm - Anterior  Recheck as needed change.  Screening exam for skin cancer Left Breast  Annual skin examination, encouraged to self examine twice annually, continued ultraviolet protection.  Neoplasm of uncertain behavior of skin Right Upper Back  Skin / nail biopsy Type of biopsy: tangential   Informed consent: discussed and consent obtained   Timeout: patient name, date of birth, surgical site, and procedure verified   Procedure prep:  Patient was prepped and draped in usual sterile fashion (Non sterile) Prep type:  Chlorhexidine Anesthesia: the lesion was anesthetized in a standard fashion   Anesthetic:  1% lidocaine w/ epinephrine 1-100,000 local infiltration Instrument used: flexible razor blade   Hemostasis achieved with: ferric subsulfate and electrodesiccation   Outcome: patient tolerated procedure well   Post-procedure details: sterile dressing applied and wound care instructions given   Dressing type: bandage and petrolatum    Destruction of lesion Complexity: simple   Destruction method: electrodesiccation and curettage    Informed consent: discussed and consent obtained   Timeout:  patient name, date of birth, surgical site, and procedure verified Anesthesia: the lesion was anesthetized in a standard fashion   Anesthetic:  1% lidocaine w/ epinephrine 1-100,000 local infiltration Curettage performed in three different directions: Yes   Electrodesiccation performed over the curetted area: Yes   Curettage cycles:  1 Margin per side (cm):  0.1 Final wound size (cm):  0.9 Hemostasis achieved with:  ferric subsulfate and electrodesiccation Outcome: patient tolerated procedure well with no complications   Post-procedure details: wound care instructions given    Specimen 1 - Surgical pathology Differential Diagnosis: R/O SCC  Check Margins: No  Treated after biopsy     I, Lavonna Monarch, MD, have reviewed all documentation for this visit.  The documentation on 01/31/20 for the exam, diagnosis, procedures, and orders are all accurate and complete.

## 2020-02-01 DIAGNOSIS — M7542 Impingement syndrome of left shoulder: Secondary | ICD-10-CM | POA: Diagnosis not present

## 2020-02-01 DIAGNOSIS — M542 Cervicalgia: Secondary | ICD-10-CM | POA: Diagnosis not present

## 2020-02-04 DIAGNOSIS — M542 Cervicalgia: Secondary | ICD-10-CM | POA: Diagnosis not present

## 2020-02-04 DIAGNOSIS — M25512 Pain in left shoulder: Secondary | ICD-10-CM | POA: Diagnosis not present

## 2020-02-05 DIAGNOSIS — M542 Cervicalgia: Secondary | ICD-10-CM | POA: Diagnosis not present

## 2020-02-05 DIAGNOSIS — M25512 Pain in left shoulder: Secondary | ICD-10-CM | POA: Diagnosis not present

## 2020-02-10 DIAGNOSIS — I10 Essential (primary) hypertension: Secondary | ICD-10-CM | POA: Diagnosis not present

## 2020-02-10 DIAGNOSIS — Z1389 Encounter for screening for other disorder: Secondary | ICD-10-CM | POA: Diagnosis not present

## 2020-02-10 DIAGNOSIS — R7303 Prediabetes: Secondary | ICD-10-CM | POA: Diagnosis not present

## 2020-02-10 DIAGNOSIS — E039 Hypothyroidism, unspecified: Secondary | ICD-10-CM | POA: Diagnosis not present

## 2020-02-10 DIAGNOSIS — Z6833 Body mass index (BMI) 33.0-33.9, adult: Secondary | ICD-10-CM | POA: Diagnosis not present

## 2020-02-10 DIAGNOSIS — E7849 Other hyperlipidemia: Secondary | ICD-10-CM | POA: Diagnosis not present

## 2020-02-10 DIAGNOSIS — Z1331 Encounter for screening for depression: Secondary | ICD-10-CM | POA: Diagnosis not present

## 2020-03-09 DIAGNOSIS — G8918 Other acute postprocedural pain: Secondary | ICD-10-CM | POA: Diagnosis not present

## 2020-03-09 DIAGNOSIS — M75112 Incomplete rotator cuff tear or rupture of left shoulder, not specified as traumatic: Secondary | ICD-10-CM | POA: Diagnosis not present

## 2020-03-09 DIAGNOSIS — M7552 Bursitis of left shoulder: Secondary | ICD-10-CM | POA: Diagnosis not present

## 2020-03-09 DIAGNOSIS — M7542 Impingement syndrome of left shoulder: Secondary | ICD-10-CM | POA: Diagnosis not present

## 2020-03-09 DIAGNOSIS — M24112 Other articular cartilage disorders, left shoulder: Secondary | ICD-10-CM | POA: Diagnosis not present

## 2020-03-09 DIAGNOSIS — M19012 Primary osteoarthritis, left shoulder: Secondary | ICD-10-CM | POA: Diagnosis not present

## 2020-03-17 DIAGNOSIS — M19012 Primary osteoarthritis, left shoulder: Secondary | ICD-10-CM | POA: Diagnosis not present

## 2020-03-23 DIAGNOSIS — M75112 Incomplete rotator cuff tear or rupture of left shoulder, not specified as traumatic: Secondary | ICD-10-CM | POA: Diagnosis not present

## 2020-03-28 DIAGNOSIS — M75112 Incomplete rotator cuff tear or rupture of left shoulder, not specified as traumatic: Secondary | ICD-10-CM | POA: Diagnosis not present

## 2020-03-29 DIAGNOSIS — R059 Cough, unspecified: Secondary | ICD-10-CM | POA: Diagnosis not present

## 2020-03-29 DIAGNOSIS — J069 Acute upper respiratory infection, unspecified: Secondary | ICD-10-CM | POA: Diagnosis not present

## 2020-03-29 DIAGNOSIS — Z20822 Contact with and (suspected) exposure to covid-19: Secondary | ICD-10-CM | POA: Diagnosis not present

## 2020-04-04 DIAGNOSIS — M75112 Incomplete rotator cuff tear or rupture of left shoulder, not specified as traumatic: Secondary | ICD-10-CM | POA: Diagnosis not present

## 2020-04-06 DIAGNOSIS — M75112 Incomplete rotator cuff tear or rupture of left shoulder, not specified as traumatic: Secondary | ICD-10-CM | POA: Diagnosis not present

## 2020-04-08 DIAGNOSIS — M654 Radial styloid tenosynovitis [de Quervain]: Secondary | ICD-10-CM | POA: Diagnosis not present

## 2020-04-11 DIAGNOSIS — M75112 Incomplete rotator cuff tear or rupture of left shoulder, not specified as traumatic: Secondary | ICD-10-CM | POA: Diagnosis not present

## 2020-04-12 DIAGNOSIS — I10 Essential (primary) hypertension: Secondary | ICD-10-CM | POA: Diagnosis not present

## 2020-04-12 DIAGNOSIS — R600 Localized edema: Secondary | ICD-10-CM | POA: Diagnosis not present

## 2020-04-12 DIAGNOSIS — G8929 Other chronic pain: Secondary | ICD-10-CM | POA: Diagnosis not present

## 2020-04-12 DIAGNOSIS — E669 Obesity, unspecified: Secondary | ICD-10-CM | POA: Diagnosis not present

## 2020-04-12 DIAGNOSIS — E785 Hyperlipidemia, unspecified: Secondary | ICD-10-CM | POA: Diagnosis not present

## 2020-04-12 DIAGNOSIS — Z008 Encounter for other general examination: Secondary | ICD-10-CM | POA: Diagnosis not present

## 2020-04-12 DIAGNOSIS — M1990 Unspecified osteoarthritis, unspecified site: Secondary | ICD-10-CM | POA: Diagnosis not present

## 2020-04-12 DIAGNOSIS — E039 Hypothyroidism, unspecified: Secondary | ICD-10-CM | POA: Diagnosis not present

## 2020-04-12 DIAGNOSIS — Z6832 Body mass index (BMI) 32.0-32.9, adult: Secondary | ICD-10-CM | POA: Diagnosis not present

## 2020-04-12 DIAGNOSIS — Z791 Long term (current) use of non-steroidal anti-inflammatories (NSAID): Secondary | ICD-10-CM | POA: Diagnosis not present

## 2020-04-12 DIAGNOSIS — Z7982 Long term (current) use of aspirin: Secondary | ICD-10-CM | POA: Diagnosis not present

## 2020-04-18 DIAGNOSIS — M75112 Incomplete rotator cuff tear or rupture of left shoulder, not specified as traumatic: Secondary | ICD-10-CM | POA: Diagnosis not present

## 2020-04-20 DIAGNOSIS — M75112 Incomplete rotator cuff tear or rupture of left shoulder, not specified as traumatic: Secondary | ICD-10-CM | POA: Diagnosis not present

## 2020-04-25 ENCOUNTER — Other Ambulatory Visit: Payer: Self-pay

## 2020-04-25 ENCOUNTER — Ambulatory Visit (INDEPENDENT_AMBULATORY_CARE_PROVIDER_SITE_OTHER): Payer: Medicare HMO | Admitting: Dermatology

## 2020-04-25 DIAGNOSIS — C4492 Squamous cell carcinoma of skin, unspecified: Secondary | ICD-10-CM

## 2020-04-25 DIAGNOSIS — L719 Rosacea, unspecified: Secondary | ICD-10-CM

## 2020-04-25 DIAGNOSIS — D044 Carcinoma in situ of skin of scalp and neck: Secondary | ICD-10-CM | POA: Diagnosis not present

## 2020-04-25 DIAGNOSIS — D485 Neoplasm of uncertain behavior of skin: Secondary | ICD-10-CM

## 2020-04-25 HISTORY — DX: Squamous cell carcinoma of skin, unspecified: C44.92

## 2020-04-25 MED ORDER — IVERMECTIN 1 % EX CREA: 1.0000 "application " | TOPICAL_CREAM | Freq: Every day | CUTANEOUS | 2 refills | Status: DC

## 2020-04-25 NOTE — Patient Instructions (Signed)

## 2020-05-03 ENCOUNTER — Telehealth: Payer: Self-pay

## 2020-05-03 ENCOUNTER — Encounter: Payer: Self-pay | Admitting: Dermatology

## 2020-05-03 NOTE — Progress Notes (Signed)
   Follow-Up Visit   Subjective  Crystal Fischer is a 65 y.o. female who presents for the following: Follow-up (Patient wants new rosacea medication, will soolantra work she stated that's what her brother takes. Patient also wants tag/mole off her neck).  Follow-up for rosacea plus new growth on anterior neck Location:  Duration:  Quality:  Associated Signs/Symptoms: Modifying Factors:  Severity:  Timing: Context:   Objective  Well appearing patient in no apparent distress; mood and affect are within normal limits. Objective  Head - Anterior (Face): Central facial patchy slightly edematous erythema without pustules; likely rosacea plus exacerbation by COVID mask.  Objective  Anterior Mid Neck: Verrucous 3 mm papule on waxy 5 mm pink base       A focused examination was performed including Head, neck, upper chest, back.. Relevant physical exam findings are noted in the Assessment and Plan.   Assessment & Plan    Rosacea Head - Anterior (Face)  Ivermectin (SOOLANTRA) 1 % CREA - Head - Anterior (Face)  Neoplasm of uncertain behavior of skin Anterior Mid Neck  Skin / nail biopsy Type of biopsy: tangential   Informed consent: discussed and consent obtained   Timeout: patient name, date of birth, surgical site, and procedure verified   Anesthesia: the lesion was anesthetized in a standard fashion   Anesthetic:  1% lidocaine w/ epinephrine 1-100,000 local infiltration Instrument used: flexible razor blade   Hemostasis achieved with: ferric subsulfate   Outcome: patient tolerated procedure well   Post-procedure details: wound care instructions given    Specimen 1 - Surgical pathology Differential Diagnosis: wart/skin tag  Check Margins: No      I, Lavonna Monarch, MD, have reviewed all documentation for this visit.  The documentation on 05/03/20 for the exam, diagnosis, procedures, and orders are all accurate and complete.

## 2020-05-03 NOTE — Telephone Encounter (Signed)
Phone call to patient with her pathology results. Path to patient.  

## 2020-05-03 NOTE — Telephone Encounter (Signed)
-----   Message from Lavonna Monarch, MD sent at 05/03/2020  6:32 AM EDT ----- Schedule surgery with Dr. Darene Lamer

## 2020-05-06 DIAGNOSIS — M19012 Primary osteoarthritis, left shoulder: Secondary | ICD-10-CM | POA: Diagnosis not present

## 2020-05-07 ENCOUNTER — Other Ambulatory Visit: Payer: Self-pay | Admitting: Cardiology

## 2020-05-17 DIAGNOSIS — I10 Essential (primary) hypertension: Secondary | ICD-10-CM | POA: Diagnosis not present

## 2020-05-17 DIAGNOSIS — R7303 Prediabetes: Secondary | ICD-10-CM | POA: Diagnosis not present

## 2020-05-17 DIAGNOSIS — E038 Other specified hypothyroidism: Secondary | ICD-10-CM | POA: Diagnosis not present

## 2020-05-17 DIAGNOSIS — E782 Mixed hyperlipidemia: Secondary | ICD-10-CM | POA: Diagnosis not present

## 2020-05-17 DIAGNOSIS — R5383 Other fatigue: Secondary | ICD-10-CM | POA: Diagnosis not present

## 2020-05-17 DIAGNOSIS — E7849 Other hyperlipidemia: Secondary | ICD-10-CM | POA: Diagnosis not present

## 2020-05-23 DIAGNOSIS — R7301 Impaired fasting glucose: Secondary | ICD-10-CM | POA: Diagnosis not present

## 2020-05-23 DIAGNOSIS — I10 Essential (primary) hypertension: Secondary | ICD-10-CM | POA: Diagnosis not present

## 2020-05-23 DIAGNOSIS — E7849 Other hyperlipidemia: Secondary | ICD-10-CM | POA: Diagnosis not present

## 2020-05-23 DIAGNOSIS — R5383 Other fatigue: Secondary | ICD-10-CM | POA: Diagnosis not present

## 2020-05-23 DIAGNOSIS — R7303 Prediabetes: Secondary | ICD-10-CM | POA: Diagnosis not present

## 2020-05-23 DIAGNOSIS — E038 Other specified hypothyroidism: Secondary | ICD-10-CM | POA: Diagnosis not present

## 2020-05-23 DIAGNOSIS — L732 Hidradenitis suppurativa: Secondary | ICD-10-CM | POA: Diagnosis not present

## 2020-05-23 DIAGNOSIS — Z6835 Body mass index (BMI) 35.0-35.9, adult: Secondary | ICD-10-CM | POA: Diagnosis not present

## 2020-05-27 DIAGNOSIS — M19012 Primary osteoarthritis, left shoulder: Secondary | ICD-10-CM | POA: Diagnosis not present

## 2020-06-23 DIAGNOSIS — Z6836 Body mass index (BMI) 36.0-36.9, adult: Secondary | ICD-10-CM | POA: Diagnosis not present

## 2020-06-23 DIAGNOSIS — L0291 Cutaneous abscess, unspecified: Secondary | ICD-10-CM | POA: Diagnosis not present

## 2020-07-07 DIAGNOSIS — N9089 Other specified noninflammatory disorders of vulva and perineum: Secondary | ICD-10-CM | POA: Diagnosis not present

## 2020-07-07 DIAGNOSIS — L732 Hidradenitis suppurativa: Secondary | ICD-10-CM | POA: Diagnosis not present

## 2020-07-10 IMAGING — CT CT HEART MORP W/ CTA COR W/ SCORE W/ CA W/CM &/OR W/O CM
4 of 7 series · 8 of 20 positions shown, 9 images · IV contrast (APPLIED)
Comparison: None.
COMPARISON: None.

Addendum:
EXAM:
OVER-READ INTERPRETATION  CT CHEST

The following report is an over-read performed by radiologist Dr.
Norainey Rauf [[email or web address]] on 05/26/2019. This
over-read does not include interpretation of cardiac or coronary
anatomy or pathology. The coronary calcium score/coronary CTA
interpretation by the cardiologist is attached.
CLINICAL DATA: Chest pain
Cardiac/Coronary CTA
TECHNIQUE: The patient was scanned on a Phillips Force scanner. A 100 kV
prospective scan was triggered in the descending thoracic aorta at
111 HU's. Axial non-contrast 3 mm slices were carried out through
the heart. The data set was analyzed on a dedicated work station and
scored using the Agatson method. Gantry rotation speed was 250 msecs
and collimation was .6 mm. No beta blockade and 0.8 mg of sl NTG was
given. The 3D data set was reconstructed in 5% intervals of the
35-75 % of the R-R cycle. Diastolic phases were analyzed on a
dedicated work station using MPR, MIP and VRT modes. The patient
received 80 cc of contrast.

[Series 6: best diast 74 % · axial · 0.38mm/px · z∈[+1217,+1260]mm · 2 of 321 slices shown, 3 images]
[im 107/321  vessel]
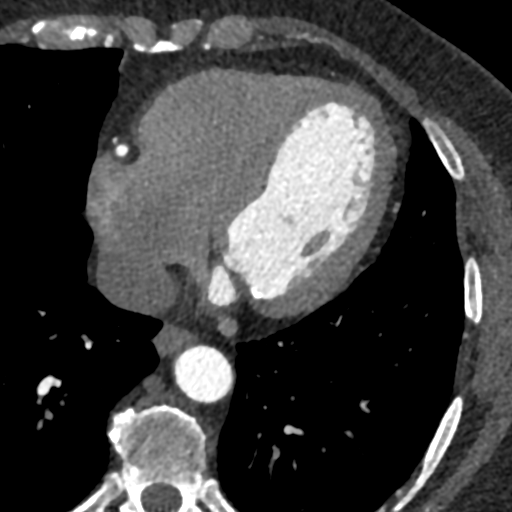
[im 107/321  lung]
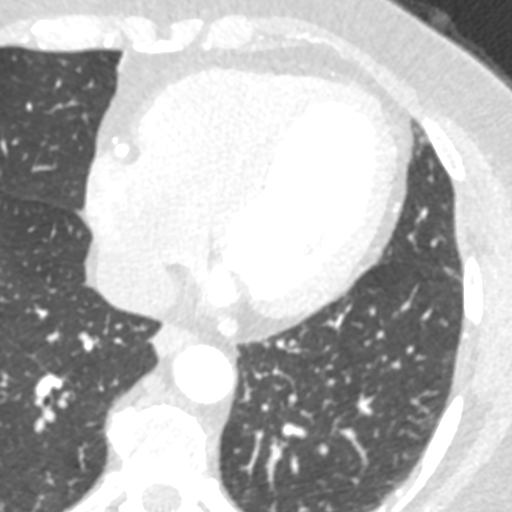
[im 214/321  vessel]
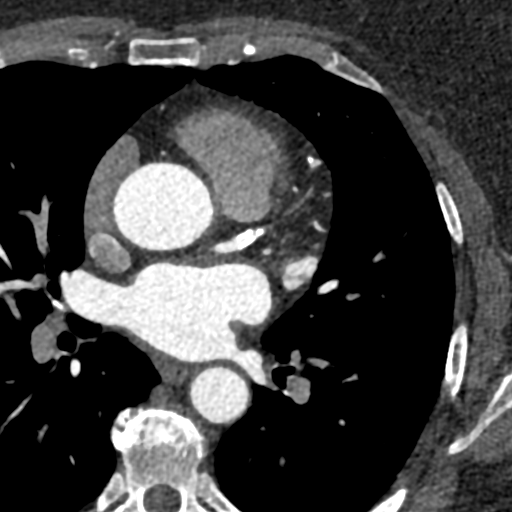

[Series 7: best syst 42 % · axial · 0.38mm/px · z∈[+1217,+1260]mm · 2 of 321 slices shown]
[im 107/321  vessel]
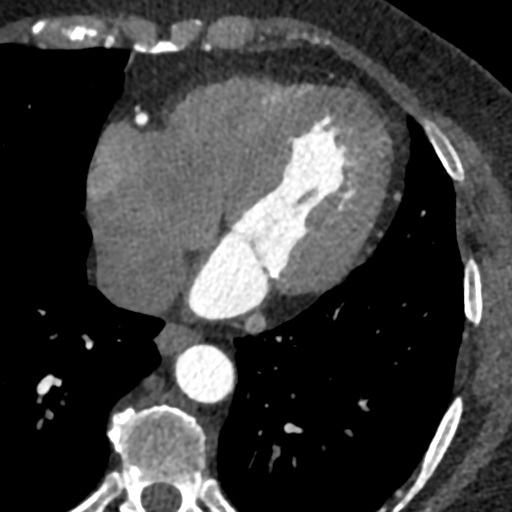
[im 214/321  vessel]
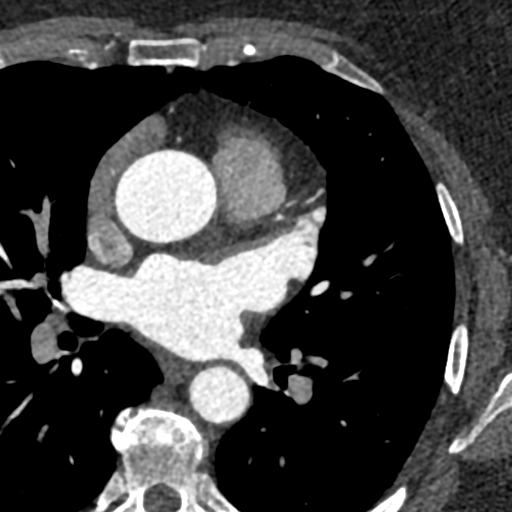

[Series 8: ts diast sharp 74 % · axial · 0.38mm/px · z∈[+1217,+1260]mm · 2 of 321 slices shown]
[im 107/321  lung]
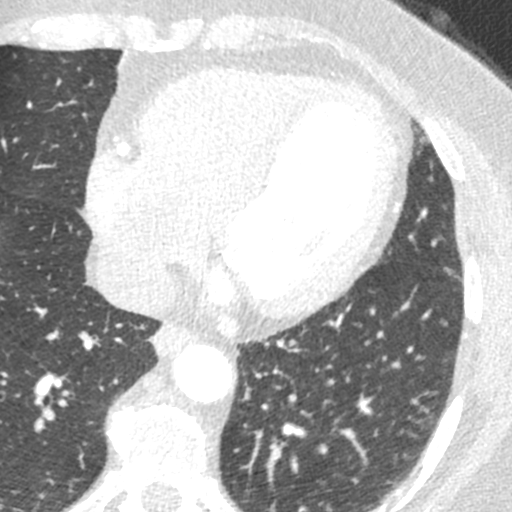
[im 214/321  lung]
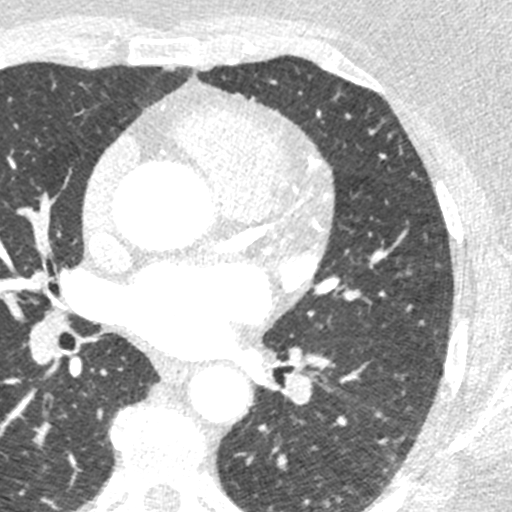

[Series 9: ts syst sharp 42 % · axial · 0.38mm/px · z∈[+1217,+1260]mm · 2 of 321 slices shown]
[im 107/321  lung]
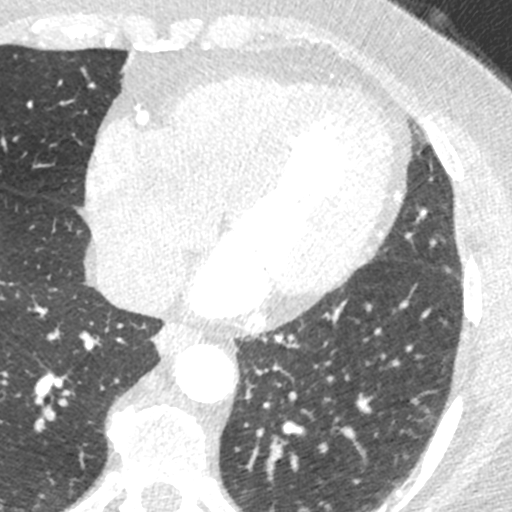
[im 214/321  lung]
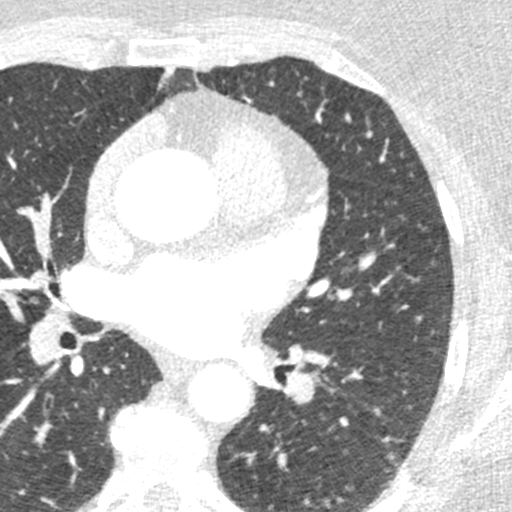

[8 of 20 positions shown; findings below may reference images not displayed]

FINDINGS: Aortic atherosclerosis. Within the visualized portions of the thorax
there are no suspicious appearing pulmonary nodules or masses, there
is no acute consolidative airspace disease, no pleural effusions, no
pneumothorax and no lymphadenopathy. Visualized portions of the
upper abdomen are unremarkable. There are no aggressive appearing
lytic or blastic lesions noted in the visualized portions of the
skeleton.
IMPRESSION: 1.  Aortic Atherosclerosis (BYU65-8VI.I).
FINDINGS: Image quality: excellent.

Noise artifact is: Limited.

Coronary Arteries:  Normal coronary origin.  Right dominance.

Left main: The left main is a large caliber vessel with a normal
take off from the left coronary cusp that bifurcates to form a left
anterior descending artery and a left circumflex artery. There is
minimal calcified plaque (<25%).

Left anterior descending artery: The proximal LAD contains heavily
calcified mid (25-49%). The mid and distal LAD contains minimal
calcified plaque (<25%). The LAD gives off 2 patent diagonal
branches.

Ramus intermedius: Patent with no evidence of plaque or stenosis.

Left circumflex artery: The LCX is non-dominant and contains minimal
calcified plaque (<25%). This vessel is small and hypoplastic.

Right coronary artery: The RCA is super dominant with normal take
off from the right coronary cusp. There is diffusely calcified
plaque that is minimal (<25%). The RCA terminates as a PDA and right
posterolateral branch with minimal calcified plaque (<25%).

Right Atrium: Right atrial size is within normal limits.

Right Ventricle: The right ventricular cavity is within normal
limits.

Left Atrium: Left atrial size is normal in size with no left atrial
appendage filling defect.

Left Ventricle: The ventricular cavity size is within normal limits.
There are no stigmata of prior infarction. There is no abnormal
filling defect.

Pulmonary arteries: Normal in size without proximal filling defect.

Pulmonary veins: Normal pulmonary venous drainage.

Pericardium: Normal thickness with no significant effusion or
calcium present.

Cardiac valves: The aortic valve is trileaflet with minimal
calcification. The mitral valve is normal structure with mild mitral
annular calcification.

Aorta: Normal caliber with no significant disease.

Extra-cardiac findings: See attached radiology report for
non-cardiac structures.
IMPRESSION: 1. Coronary calcium score of 5584. This was 99th percentile for age
and sex matched controls.

2. Normal coronary origin with super dominant RCA.

3. Mild calcified plaque (25-49%) in the proximal LAD. Heavy
blooming artifact noted.

4. Minimal calcified plaque in the LCX/RCA (<25%).

5. Mild mitral annular calcification.

RECOMMENDATIONS:
1. Mild non-obstructive CAD (25-49%) in the proximal LAD. Due to
heavy blooming artifact, will send CT FFR to ensure no obstructive
disease.

*** End of Addendum ***
EXAM:
OVER-READ INTERPRETATION  CT CHEST

The following report is an over-read performed by radiologist Dr.
Norainey Rauf [[email or web address]] on 05/26/2019. This
over-read does not include interpretation of cardiac or coronary
anatomy or pathology. The coronary calcium score/coronary CTA
interpretation by the cardiologist is attached.
FINDINGS: Aortic atherosclerosis. Within the visualized portions of the thorax
there are no suspicious appearing pulmonary nodules or masses, there
is no acute consolidative airspace disease, no pleural effusions, no
pneumothorax and no lymphadenopathy. Visualized portions of the
upper abdomen are unremarkable. There are no aggressive appearing
lytic or blastic lesions noted in the visualized portions of the
skeleton.
IMPRESSION: 1.  Aortic Atherosclerosis (BYU65-8VI.I).

## 2020-07-14 ENCOUNTER — Ambulatory Visit (INDEPENDENT_AMBULATORY_CARE_PROVIDER_SITE_OTHER): Payer: Medicare HMO | Admitting: Dermatology

## 2020-07-14 ENCOUNTER — Other Ambulatory Visit: Payer: Self-pay

## 2020-07-14 ENCOUNTER — Encounter: Payer: Self-pay | Admitting: Dermatology

## 2020-07-14 DIAGNOSIS — L719 Rosacea, unspecified: Secondary | ICD-10-CM

## 2020-07-14 DIAGNOSIS — L820 Inflamed seborrheic keratosis: Secondary | ICD-10-CM

## 2020-07-14 DIAGNOSIS — D044 Carcinoma in situ of skin of scalp and neck: Secondary | ICD-10-CM | POA: Diagnosis not present

## 2020-07-14 DIAGNOSIS — C4492 Squamous cell carcinoma of skin, unspecified: Secondary | ICD-10-CM

## 2020-07-14 DIAGNOSIS — D492 Neoplasm of unspecified behavior of bone, soft tissue, and skin: Secondary | ICD-10-CM

## 2020-07-14 MED ORDER — IVERMECTIN 1 % EX CREA: 1.0000 "application " | TOPICAL_CREAM | Freq: Every day | CUTANEOUS | 2 refills | Status: AC

## 2020-07-14 NOTE — Patient Instructions (Signed)

## 2020-07-15 DIAGNOSIS — M19012 Primary osteoarthritis, left shoulder: Secondary | ICD-10-CM | POA: Diagnosis not present

## 2020-07-21 DIAGNOSIS — M25511 Pain in right shoulder: Secondary | ICD-10-CM | POA: Diagnosis not present

## 2020-07-22 DIAGNOSIS — M5412 Radiculopathy, cervical region: Secondary | ICD-10-CM | POA: Diagnosis not present

## 2020-07-22 DIAGNOSIS — M19012 Primary osteoarthritis, left shoulder: Secondary | ICD-10-CM | POA: Diagnosis not present

## 2020-07-24 ENCOUNTER — Encounter: Payer: Self-pay | Admitting: Dermatology

## 2020-07-24 NOTE — Progress Notes (Signed)
   Follow-Up Visit   Subjective  Crystal Fischer is a 65 y.o. female who presents for the following: Procedure (Here for treatment- anterior mid neck- cis x 1).  Biopsy-proven CIS neck plus growing crust left chest Location:  Duration:  Quality:  Associated Signs/Symptoms: Modifying Factors:  Severity:  Timing: Context:   Objective  Well appearing patient in no apparent distress; mood and affect are within normal limits. Left Chest Waxy pink 7 mm crust with historical growth, suspect CIS     Anterior Mid Neck Biopsy site identified by nurse, patient, and me    A focused examination was performed including head, neck, upper chest.. Relevant physical exam findings are noted in the Assessment and Plan.   Assessment & Plan    Neoplasm of skin Left Chest  Skin / nail biopsy Type of biopsy: tangential   Informed consent: discussed and consent obtained   Timeout: patient name, date of birth, surgical site, and procedure verified   Procedure prep:  Patient was prepped and draped in usual sterile fashion (Non sterile) Prep type:  Chlorhexidine Anesthesia: the lesion was anesthetized in a standard fashion   Anesthetic:  1% lidocaine w/ epinephrine 1-100,000 local infiltration Instrument used: flexible razor blade   Outcome: patient tolerated procedure well   Post-procedure details: wound care instructions given    Specimen 1 - Surgical pathology Differential Diagnosis: r/o sk  Check Margins: No  Rosacea Mid Forehead; Right Temple; Right Malar Cheek; Right Forehead  Ivermectin (SOOLANTRA) 1 % CREA - Mid Forehead, Right Forehead, Right Malar Cheek, Right Temple Apply 1 application topically daily.  Carcinoma in situ of skin of scalp and neck Anterior Mid Neck  Destruction of lesion Complexity: simple   Destruction method: electrodesiccation and curettage   Informed consent: discussed and consent obtained   Timeout:  patient name, date of birth, surgical site, and  procedure verified Anesthesia: the lesion was anesthetized in a standard fashion   Anesthetic:  1% lidocaine w/ epinephrine 1-100,000 local infiltration Curettage performed in three different directions: Yes   Curettage cycles:  3 Lesion length (cm):  1.1 Lesion width (cm):  1.1 Margin per side (cm):  0 Final wound size (cm):  1.1 Hemostasis achieved with:  aluminum chloride Outcome: patient tolerated procedure well with no complications   Post-procedure details: wound care instructions given        I, Lavonna Monarch, MD, have reviewed all documentation for this visit.  The documentation on 07/24/20 for the exam, diagnosis, procedures, and orders are all accurate and complete.

## 2020-08-02 DIAGNOSIS — H524 Presbyopia: Secondary | ICD-10-CM | POA: Diagnosis not present

## 2020-08-02 DIAGNOSIS — Z0100 Encounter for examination of eyes and vision without abnormal findings: Secondary | ICD-10-CM | POA: Diagnosis not present

## 2020-08-08 DIAGNOSIS — Z20828 Contact with and (suspected) exposure to other viral communicable diseases: Secondary | ICD-10-CM | POA: Diagnosis not present

## 2020-08-08 DIAGNOSIS — J45909 Unspecified asthma, uncomplicated: Secondary | ICD-10-CM | POA: Diagnosis not present

## 2020-08-16 DIAGNOSIS — M47812 Spondylosis without myelopathy or radiculopathy, cervical region: Secondary | ICD-10-CM | POA: Diagnosis not present

## 2020-08-16 DIAGNOSIS — M542 Cervicalgia: Secondary | ICD-10-CM | POA: Diagnosis not present

## 2020-08-18 DIAGNOSIS — M542 Cervicalgia: Secondary | ICD-10-CM | POA: Diagnosis not present

## 2020-08-18 DIAGNOSIS — M479 Spondylosis, unspecified: Secondary | ICD-10-CM | POA: Diagnosis not present

## 2020-08-22 DIAGNOSIS — M542 Cervicalgia: Secondary | ICD-10-CM | POA: Diagnosis not present

## 2020-08-22 DIAGNOSIS — M479 Spondylosis, unspecified: Secondary | ICD-10-CM | POA: Diagnosis not present

## 2020-08-24 DIAGNOSIS — M542 Cervicalgia: Secondary | ICD-10-CM | POA: Diagnosis not present

## 2020-08-24 DIAGNOSIS — M479 Spondylosis, unspecified: Secondary | ICD-10-CM | POA: Diagnosis not present

## 2020-08-29 DIAGNOSIS — M479 Spondylosis, unspecified: Secondary | ICD-10-CM | POA: Diagnosis not present

## 2020-08-29 DIAGNOSIS — M542 Cervicalgia: Secondary | ICD-10-CM | POA: Diagnosis not present

## 2020-08-31 DIAGNOSIS — M479 Spondylosis, unspecified: Secondary | ICD-10-CM | POA: Diagnosis not present

## 2020-08-31 DIAGNOSIS — M542 Cervicalgia: Secondary | ICD-10-CM | POA: Diagnosis not present

## 2020-10-04 DIAGNOSIS — M47812 Spondylosis without myelopathy or radiculopathy, cervical region: Secondary | ICD-10-CM | POA: Diagnosis not present

## 2020-10-13 DIAGNOSIS — J069 Acute upper respiratory infection, unspecified: Secondary | ICD-10-CM | POA: Diagnosis not present

## 2020-10-13 DIAGNOSIS — Z20828 Contact with and (suspected) exposure to other viral communicable diseases: Secondary | ICD-10-CM | POA: Diagnosis not present

## 2020-10-13 DIAGNOSIS — R059 Cough, unspecified: Secondary | ICD-10-CM | POA: Diagnosis not present

## 2020-10-16 DIAGNOSIS — J069 Acute upper respiratory infection, unspecified: Secondary | ICD-10-CM | POA: Diagnosis not present

## 2020-11-04 DIAGNOSIS — M47812 Spondylosis without myelopathy or radiculopathy, cervical region: Secondary | ICD-10-CM | POA: Diagnosis not present

## 2020-11-23 DIAGNOSIS — R7303 Prediabetes: Secondary | ICD-10-CM | POA: Diagnosis not present

## 2020-11-23 DIAGNOSIS — E039 Hypothyroidism, unspecified: Secondary | ICD-10-CM | POA: Diagnosis not present

## 2020-11-23 DIAGNOSIS — I10 Essential (primary) hypertension: Secondary | ICD-10-CM | POA: Diagnosis not present

## 2020-11-23 DIAGNOSIS — E7849 Other hyperlipidemia: Secondary | ICD-10-CM | POA: Diagnosis not present

## 2020-11-23 DIAGNOSIS — R5383 Other fatigue: Secondary | ICD-10-CM | POA: Diagnosis not present

## 2020-11-23 DIAGNOSIS — E782 Mixed hyperlipidemia: Secondary | ICD-10-CM | POA: Diagnosis not present

## 2020-11-29 DIAGNOSIS — E7849 Other hyperlipidemia: Secondary | ICD-10-CM | POA: Diagnosis not present

## 2020-11-29 DIAGNOSIS — R0609 Other forms of dyspnea: Secondary | ICD-10-CM | POA: Diagnosis not present

## 2020-11-29 DIAGNOSIS — M545 Low back pain, unspecified: Secondary | ICD-10-CM | POA: Diagnosis not present

## 2020-11-29 DIAGNOSIS — R5383 Other fatigue: Secondary | ICD-10-CM | POA: Diagnosis not present

## 2020-11-29 DIAGNOSIS — N3090 Cystitis, unspecified without hematuria: Secondary | ICD-10-CM | POA: Diagnosis not present

## 2020-11-29 DIAGNOSIS — L732 Hidradenitis suppurativa: Secondary | ICD-10-CM | POA: Diagnosis not present

## 2020-11-29 DIAGNOSIS — E782 Mixed hyperlipidemia: Secondary | ICD-10-CM | POA: Diagnosis not present

## 2020-11-29 DIAGNOSIS — E6609 Other obesity due to excess calories: Secondary | ICD-10-CM | POA: Diagnosis not present

## 2020-11-29 DIAGNOSIS — Z6836 Body mass index (BMI) 36.0-36.9, adult: Secondary | ICD-10-CM | POA: Diagnosis not present

## 2020-11-29 DIAGNOSIS — E038 Other specified hypothyroidism: Secondary | ICD-10-CM | POA: Diagnosis not present

## 2020-11-29 DIAGNOSIS — M542 Cervicalgia: Secondary | ICD-10-CM | POA: Diagnosis not present

## 2020-11-29 DIAGNOSIS — R7301 Impaired fasting glucose: Secondary | ICD-10-CM | POA: Diagnosis not present

## 2020-11-29 DIAGNOSIS — R7303 Prediabetes: Secondary | ICD-10-CM | POA: Diagnosis not present

## 2020-11-29 DIAGNOSIS — G5603 Carpal tunnel syndrome, bilateral upper limbs: Secondary | ICD-10-CM | POA: Diagnosis not present

## 2020-11-29 DIAGNOSIS — I10 Essential (primary) hypertension: Secondary | ICD-10-CM | POA: Diagnosis not present

## 2020-12-05 DIAGNOSIS — M47812 Spondylosis without myelopathy or radiculopathy, cervical region: Secondary | ICD-10-CM | POA: Diagnosis not present

## 2020-12-05 DIAGNOSIS — M5412 Radiculopathy, cervical region: Secondary | ICD-10-CM | POA: Diagnosis not present

## 2020-12-06 NOTE — Progress Notes (Signed)
Synopsis: Referred for DOE since covid-19 infection by Manon Hilding, MD  Subjective:   PATIENT ID: Crystal Fischer GENDER: female DOB: 1955/08/11, MRN: [id number]  Chief Complaint  Patient presents with   Consult    Pt states here for SOB says after being around her covid pos mother she got sick and could not walk as she use to    59yF with history of nCAD, SCC skin, hypothyroid referred for DOE after covid-19 infection. Never did have asthma as a kid.   6 weeks ago was taking care of mother while she had covid-19.   Prior to this she actually noted some dyspnea when she was walking up incline. After she thinks she may have had covid-19 (never tested positive) her DOE worsened again. Her cough has persisted as well and still has some pleuritic pain esp when coughing. Cough is dry. She does have some swelling in legs which is stable over years. She does not have orthopnea. She doesn't snore loudly. Occasional PND. She does probably have excessive daytime sleepiness.    She has not had course of prednisone recently. She has never had a blood clot before.   Otherwise pertinent review of systems is negative.  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 She smoked 20 years - 1-2 packs per day, quit 27 years ago. 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 Has lived in [place] as well. She has a cat at home.   Past Medical History:  Diagnosis Date   Coronary atherosclerosis    Nonobstructive by cardiac CTA with FFR May 2021   Essential hypertension    Hypothyroidism    Mixed hyperlipidemia    SCC (squamous cell carcinoma) 12/20/2014   RIGHT CHEEK TX CAURET AND CAUTERY   SCC (squamous cell carcinoma) 01/28/2018   LEFT OUTER CALF TX WITH BX   SCCA (squamous cell carcinoma) of skin 04/25/2020   Anterior Mid Neck (in situ)([id number])   Squamous cell carcinoma of skin 07/28/2014    LEFT POST UPPER ARM  CURET AND CAUTERY     Family History  Problem Relation Age of Onset   Heart attack Father    Cervical cancer Mother    Thyroid disease Neg Hx      Past Surgical History:  Procedure Laterality Date   APPENDECTOMY  1971   CHOLECYSTECTOMY  2006   FOOT SURGERY  06/06/05   bunion   TONSILLECTOMY AND ADENOIDECTOMY     TUBAL LIGATION  1982   URETHRA SURGERY  06-2003   urethral suspension    Social History   Socioeconomic History   Marital status: Divorced    Spouse name: Not on file   Number of children: Not on file   Years of education: Not on file   Highest education level: Not on file  Occupational History   Not on file  Tobacco Use   Smoking status: Former    Packs/day: 2.00    Years: 20.00    Pack years: 40.00    Types: Cigarettes    Start date: 72    Quit date: 07/04/1993    Years since quitting: 27.4   Smokeless tobacco: Never  Vaping Use   Vaping Use: Never used  Substance and Sexual Activity   Alcohol use: Yes    Alcohol/week: 0.0 standard drinks   Drug use: Never  Sexual activity: Not on file  Other Topics Concern   Not on file  Social History Narrative   Not on file   Social Determinants of Health   Financial Resource Strain: Not on file  Food Insecurity: Not on file  Transportation Needs: Not on file  Physical Activity: Not on file  Stress: Not on file  Social Connections: Not on file  Intimate Partner Violence: Not on file     Allergies  Allergen Reactions   Codeine Nausea Only     Outpatient Medications Prior to Visit  Medication Sig Dispense Refill   atorvastatin (LIPITOR) 20 MG tablet TAKE 1 TABLET BY MOUTH EVERY DAY 90 tablet 2   furosemide (LASIX) 40 MG tablet Take 40 mg by mouth daily.     Ivermectin (SOOLANTRA) 1 % CREA Apply 1 application topically daily. 45 g 2   levothyroxine (SYNTHROID) 200 MCG tablet Take 200 mcg by mouth every other day.     levothyroxine (SYNTHROID, LEVOTHROID) 175 MCG tablet Take  175 mcg by mouth every other day.      lisinopril (PRINIVIL,ZESTRIL) 10 MG tablet Take 10 mg by mouth daily.     Magnesium 250 MG TABS Take 250 mg by mouth.     aspirin EC 81 MG tablet Take 1 tablet (81 mg total) by mouth daily.     TURMERIC PO Take 1 tablet by mouth daily.     zolpidem (AMBIEN) 10 MG tablet Take 1 tablet by mouth at bedtime as needed. (Patient not taking: Reported on 12/07/2020)  3   No facility-administered medications prior to visit.       Objective:   Physical Exam:  General appearance: 65 y.o., female, NAD, conversant  Eyes: anicteric sclerae; PERRL, tracking appropriately HENT: NCAT; MMM Neck: Trachea midline; no lymphadenopathy, no JVD Lungs: CTAB, no crackles, no wheeze, with normal respiratory effort CV: RRR, +systolic murmur Abdomen: Soft, non-tender; non-distended, BS present  Extremities: No peripheral edema, warm Skin: Normal turgor and texture; no rash Psych: Appropriate affect Neuro: Alert and oriented to person and place, no focal deficit     Vitals:   12/07/20 0920  BP: 122/72  Pulse: 90  Temp: 98.7 F (37.1 C)  TempSrc: Oral  SpO2: 97%  Weight: 204 lb (92.5 kg)  Height: 5\' 3"  (1.6 m)   97% on RA BMI Readings from Last 3 Encounters:  12/07/20 36.14 kg/m  11/25/19 32.95 kg/m  07/22/19 37.24 kg/m   Wt Readings from Last 3 Encounters:  12/07/20 204 lb (92.5 kg)  11/25/19 186 lb (84.4 kg)  07/22/19 210 lb 3.2 oz (95.3 kg)     CBC No results found for: WBC, RBC, HGB, HCT, PLT, MCV, MCH, MCHC, RDW, LYMPHSABS, MONOABS, EOSABS, BASOSABS   Chest Imaging: CT Coronaries 05/2019 lung bases reviewed by me unremarkable  Pulmonary Functions Testing Results: No flowsheet data found.   Echocardiogram:   TTE 04/2019: G1DD, mild AS  [facility] stress 12/2019: Blood pressure demonstrated a hypertensive response to exercise. There was no ST segment deviation noted during stress. The study is normal. There are no perfusion defects This  is a low risk study. [facility] treadmill score of 7 supports low risk for major cardiac events. The left ventricular ejection fraction is hyperdynamic (>65%).      Assessment & Plan:   # DOE: May be multifactorial but differential is broad with possible components of diastolic dysfunction, progressive AS (systolic murmur present today), COPD (lengthy smoking hx)/asthma. Worries about whether recurrent URI/LRTI this year  could be manifestation of COPD.  # Subacute cough: Perhaps post viral. ACEi cough also possible as well as GERD. Asthma, COPD alternative possibilities as well.  Plan: - BNP, CBC/diff, tsh - PFTs on same day as next visit - CXR today - if cough persistent, discuss transition off ACEi next visit - discuss whether to pursue sleep study next visit     Maryjane Hurter, MD [facility] 12/07/2020 9:40 AM

## 2020-12-07 ENCOUNTER — Ambulatory Visit (INDEPENDENT_AMBULATORY_CARE_PROVIDER_SITE_OTHER): Payer: Medicare HMO

## 2020-12-07 ENCOUNTER — Other Ambulatory Visit: Payer: Self-pay

## 2020-12-07 ENCOUNTER — Encounter: Payer: Self-pay | Admitting: Student

## 2020-12-07 ENCOUNTER — Ambulatory Visit (INDEPENDENT_AMBULATORY_CARE_PROVIDER_SITE_OTHER): Payer: Medicare HMO | Admitting: Student

## 2020-12-07 VITALS — BP 122/72 | HR 90 | Temp 98.7°F | Ht 63.0 in | Wt 204.0 lb

## 2020-12-07 DIAGNOSIS — R052 Subacute cough: Secondary | ICD-10-CM

## 2020-12-07 DIAGNOSIS — R0609 Other forms of dyspnea: Secondary | ICD-10-CM

## 2020-12-07 DIAGNOSIS — R0600 Dyspnea, unspecified: Secondary | ICD-10-CM | POA: Diagnosis not present

## 2020-12-07 LAB — CBC WITH DIFFERENTIAL/PLATELET
Basophils Absolute: 0.1 10*3/uL (ref 0.0–0.1)
Basophils Relative: 0.6 % (ref 0.0–3.0)
Eosinophils Absolute: 0.1 10*3/uL (ref 0.0–0.7)
Eosinophils Relative: 1.1 % (ref 0.0–5.0)
HCT: 41.8 % (ref 36.0–46.0)
Hemoglobin: 13.6 g/dL (ref 12.0–15.0)
Lymphocytes Relative: 28.7 % (ref 12.0–46.0)
Lymphs Abs: 3.1 10*3/uL (ref 0.7–4.0)
MCHC: 32.6 g/dL (ref 30.0–36.0)
MCV: 87.3 fl (ref 78.0–100.0)
Monocytes Absolute: 0.7 10*3/uL (ref 0.1–1.0)
Monocytes Relative: 6.6 % (ref 3.0–12.0)
Neutro Abs: 6.8 10*3/uL (ref 1.4–7.7)
Neutrophils Relative %: 63 % (ref 43.0–77.0)
Platelets: 376 10*3/uL (ref 150.0–400.0)
RBC: 4.78 Mil/uL (ref 3.87–5.11)
RDW: 14.8 % (ref 11.5–15.5)
WBC: 10.8 10*3/uL — ABNORMAL HIGH (ref 4.0–10.5)

## 2020-12-07 LAB — TSH: TSH: 0.92 u[IU]/mL (ref 0.35–5.50)

## 2020-12-07 LAB — BRAIN NATRIURETIC PEPTIDE: Pro B Natriuretic peptide (BNP): 18 pg/mL (ref 0.0–100.0)

## 2020-12-07 NOTE — Patient Instructions (Addendum)
-   Breathing tests in a month on same day as clinic appointment - chest x ray today, labs today - you will be called to schedule echo - see you in a month to review all of this!

## 2020-12-21 ENCOUNTER — Emergency Department (HOSPITAL_COMMUNITY): Payer: Medicare HMO

## 2020-12-21 ENCOUNTER — Encounter (HOSPITAL_COMMUNITY): Payer: Self-pay

## 2020-12-21 ENCOUNTER — Emergency Department (HOSPITAL_COMMUNITY)
Admission: EM | Admit: 2020-12-21 | Discharge: 2020-12-21 | Disposition: A | Discharge status: Home / Self Care | Payer: Medicare HMO | Attending: Emergency Medicine | Admitting: Emergency Medicine

## 2020-12-21 ENCOUNTER — Other Ambulatory Visit: Payer: Self-pay

## 2020-12-21 DIAGNOSIS — Z85828 Personal history of other malignant neoplasm of skin: Secondary | ICD-10-CM | POA: Insufficient documentation

## 2020-12-21 DIAGNOSIS — R0789 Other chest pain: Secondary | ICD-10-CM | POA: Insufficient documentation

## 2020-12-21 DIAGNOSIS — I10 Essential (primary) hypertension: Secondary | ICD-10-CM | POA: Diagnosis not present

## 2020-12-21 DIAGNOSIS — Z87891 Personal history of nicotine dependence: Secondary | ICD-10-CM | POA: Diagnosis not present

## 2020-12-21 DIAGNOSIS — Z79899 Other long term (current) drug therapy: Secondary | ICD-10-CM | POA: Insufficient documentation

## 2020-12-21 DIAGNOSIS — R079 Chest pain, unspecified: Secondary | ICD-10-CM | POA: Diagnosis not present

## 2020-12-21 DIAGNOSIS — Z7982 Long term (current) use of aspirin: Secondary | ICD-10-CM | POA: Insufficient documentation

## 2020-12-21 DIAGNOSIS — R110 Nausea: Secondary | ICD-10-CM | POA: Insufficient documentation

## 2020-12-21 DIAGNOSIS — E039 Hypothyroidism, unspecified: Secondary | ICD-10-CM | POA: Diagnosis not present

## 2020-12-21 LAB — CBC
HCT: 36.8 % (ref 36.0–46.0)
Hemoglobin: 12.3 g/dL (ref 12.0–15.0)
MCH: 30.1 pg (ref 26.0–34.0)
MCHC: 33.4 g/dL (ref 30.0–36.0)
MCV: 90.2 fL (ref 80.0–100.0)
Platelets: 297 10*3/uL (ref 150–400)
RBC: 4.08 MIL/uL (ref 3.87–5.11)
RDW: 14 % (ref 11.5–15.5)
WBC: 10.1 10*3/uL (ref 4.0–10.5)
nRBC: 0 % (ref 0.0–0.2)

## 2020-12-21 LAB — BASIC METABOLIC PANEL
Anion gap: 9 (ref 5–15)
BUN: 14 mg/dL (ref 8–23)
CO2: 25 mmol/L (ref 22–32)
Calcium: 8.7 mg/dL — ABNORMAL LOW (ref 8.9–10.3)
Chloride: 104 mmol/L (ref 98–111)
Creatinine, Ser: 0.67 mg/dL (ref 0.44–1.00)
GFR, Estimated: >60 mL/min (ref >60)
Glucose, Bld: 110 mg/dL — ABNORMAL HIGH (ref 70–99)
Potassium: 3.5 mmol/L (ref 3.5–5.1)
Sodium: 138 mmol/L (ref 135–145)

## 2020-12-21 LAB — TROPONIN I (HIGH SENSITIVITY)
Troponin I (High Sensitivity): 4 ng/L (ref <18)
Troponin I (High Sensitivity): 4 ng/L (ref <18)

## 2020-12-21 MED ORDER — OMEPRAZOLE 20 MG PO CPDR: 20.0000 mg | DELAYED_RELEASE_CAPSULE | Freq: Every day | ORAL | 0 refills | Status: DC

## 2020-12-21 MED ORDER — ASPIRIN 81 MG PO CHEW
324.0000 mg | CHEWABLE_TABLET | Freq: Once | ORAL | Status: AC
  Administered 2020-12-21: 18:00:00 324 mg via ORAL
  Filled 2020-12-21: qty 4

## 2020-12-21 NOTE — ED Triage Notes (Signed)
Chest pain since Saturday, comes and goes, she feels when she walks her breathing gets bad, pt alert and oriented, skin warm and dry.  No heart hx

## 2020-12-21 NOTE — Discharge Instructions (Signed)
Take the medications as prescribed.  Follow-up with your cardiology doctor to be rechecked.  Return as needed for worsening or recurrent symptoms

## 2020-12-21 NOTE — ED Provider Notes (Signed)
[facility] Provider Note   CSN: [id number] Arrival date & time: 12/21/20  1614     History Chief Complaint  Patient presents with   Chest Pain    Crystal Fischer is a 65 y.o. female.  HPI  HPI: A 65 year old patient with a history of hypertension and hypercholesterolemia presents for evaluation of chest pain. Initial onset of pain was approximately 1-3 hours ago. The patient's chest pain is described as heaviness/pressure/tightness and is worse with exertion. The patient complains of nausea. The patient's chest pain is middle- or left-sided, is not well-localized, is not sharp and does radiate to the arms/jaw/neck. The patient denies diaphoresis. The patient has no history of stroke, has no history of peripheral artery disease, has not smoked in the past 90 days, denies any history of treated diabetes, has no relevant family history of coronary artery disease (first degree relative at less than age 25) and does not have an elevated BMI (>=30).  Has a history of nonobstructive coronary artery disease.  Patient did have a cardiac cath in May 2021.  Symptoms started again over the last few days.  Episodes are intermittent lasting maybe 30 minutes at a time.  She did have another episode just prior to arrival. Past Medical History:  Diagnosis Date   Coronary atherosclerosis    Nonobstructive by cardiac CTA with FFR May 2021   Essential hypertension    Hypothyroidism    Mixed hyperlipidemia    SCC (squamous cell carcinoma) 12/20/2014   RIGHT CHEEK TX CAURET AND CAUTERY   SCC (squamous cell carcinoma) 01/28/2018   LEFT OUTER CALF TX WITH BX   SCCA (squamous cell carcinoma) of skin 04/25/2020   Anterior Mid Neck (in situ)(CX35FU)   Squamous cell carcinoma of skin 07/28/2014   LEFT POST UPPER ARM  CURET AND CAUTERY    Patient Active Problem List   Diagnosis Date Noted   Hypothyroidism 02/22/2015   HTN (hypertension) 02/22/2015   Dyslipidemia 02/22/2015    Past  Surgical History:  Procedure Laterality Date   APPENDECTOMY  1971   CHOLECYSTECTOMY  2006   FOOT SURGERY  06/06/05   bunion   [place]   URETHRA SURGERY  06-2003   urethral suspension     OB History   No obstetric history on file.     Family History  Problem Relation Age of Onset   Heart attack Father    Cervical cancer Mother    Thyroid disease Neg Hx     Social History   Tobacco Use   Smoking status: Former    Packs/day: 2.00    Years: 20.00    Pack years: 40.00    Types: Cigarettes    Start date: 26    Quit date: 07/04/1993    Years since quitting: 27.4   Smokeless tobacco: Never  Vaping Use   Vaping Use: Never used  Substance Use Topics   Alcohol use: Yes    Alcohol/week: 0.0 standard drinks   Drug use: Never    Home Medications Prior to Admission medications   Medication Sig Start Date End Date Taking? Authorizing Provider  atorvastatin (LIPITOR) 20 MG tablet TAKE 1 TABLET BY MOUTH EVERY DAY 05/09/20  Yes Satira Sark, MD  furosemide (LASIX) 40 MG tablet Take 40 mg by mouth daily.   Yes [provider]  ibuprofen (ADVIL) 200 MG tablet Take 800 mg by mouth every 6 (six) hours as  needed.   Yes [provider]  Ivermectin (SOOLANTRA) 1 % CREA Apply 1 application topically daily. 07/14/20  Yes Lavonna Monarch, MD  levothyroxine (SYNTHROID) 200 MCG tablet Take 200 mcg by mouth every other day.   Yes [provider]  levothyroxine (SYNTHROID, LEVOTHROID) 175 MCG tablet Take 175 mcg by mouth every other day.    Yes [provider]  lisinopril (PRINIVIL,ZESTRIL) 10 MG tablet Take 10 mg by mouth daily.   Yes [provider]  Magnesium 250 MG TABS Take 250 mg by mouth.   Yes [provider]  omeprazole (PRILOSEC) 20 MG capsule Take 1 capsule (20 mg total) by mouth daily. 12/21/20  Yes Dorie Rank, MD  aspirin EC 81 MG tablet Take 1 tablet (81 mg total) by mouth  daily. 06/02/19   Herminio Commons, MD  TURMERIC PO Take 1 tablet by mouth daily.    [provider]  zolpidem (AMBIEN) 10 MG tablet Take 1 tablet by mouth at bedtime as needed. Patient not taking: Reported on 12/07/2020 03/03/15   [provider]    Allergies    Codeine  Review of Systems   Review of Systems  All other systems reviewed and are negative.  Physical Exam Updated Vital Signs BP (!) 150/67    Pulse 76    Temp 98.1 F (36.7 C) (Oral)    Resp (!) 25    Ht 1.6 m (5\' 3" )    Wt 90.7 kg    SpO2 95%    BMI 35.43 kg/m   Physical Exam Vitals and nursing note reviewed.  Constitutional:      General: She is not in acute distress.    Appearance: She is well-developed.  HENT:     Head: Normocephalic and atraumatic.     Right Ear: External ear normal.     Left Ear: External ear normal.  Eyes:     General: No scleral icterus.       Right eye: No discharge.        Left eye: No discharge.     Conjunctiva/sclera: Conjunctivae normal.  Neck:     Trachea: No tracheal deviation.  Cardiovascular:     Rate and Rhythm: Normal rate and regular rhythm.  Pulmonary:     Effort: Pulmonary effort is normal. No respiratory distress.     Breath sounds: Normal breath sounds. No stridor. No wheezing or rales.  Abdominal:     General: Bowel sounds are normal. There is no distension.     Palpations: Abdomen is soft.     Tenderness: There is no abdominal tenderness. There is no guarding or rebound.  Musculoskeletal:        General: No tenderness or deformity.     Cervical back: Neck supple.  Skin:    General: Skin is warm and dry.     Findings: No rash.  Neurological:     General: No focal deficit present.     Mental Status: She is alert.     Cranial Nerves: No cranial nerve deficit (no facial droop, extraocular movements intact, no slurred speech).     Sensory: No sensory deficit.     Motor: No abnormal muscle tone or seizure activity.     Coordination: Coordination  normal.  Psychiatric:        Mood and Affect: Mood normal.    ED Results / Procedures / Treatments   Labs (all labs ordered are listed, but only abnormal results are displayed) Labs Reviewed  BASIC METABOLIC PANEL - Abnormal; Notable for the following components:      Result Value   Glucose, Bld 110 (*)    Calcium 8.7 (*)    All other components within normal limits  CBC  CBG MONITORING, ED  TROPONIN I (HIGH SENSITIVITY)  TROPONIN I (HIGH SENSITIVITY)    EKG None  Radiology DG Chest Portable 1 View  Result Date: 12/21/2020 CLINICAL DATA:  Chest pain EXAM: PORTABLE CHEST 1 VIEW COMPARISON:  Chest radiograph dated December 07, 2020 FINDINGS: The heart size and mediastinal contours are within normal limits. Both lungs are clear. The visualized skeletal structures are unremarkable. IMPRESSION: No active disease. Electronically Signed   By: Keane Police D.O.   On: 12/21/2020 17:52    Procedures Procedures   Medications Ordered in ED Medications  aspirin chewable tablet 324 mg (324 mg Oral Given 12/21/20 1739)    ED Course  I have reviewed the triage vital signs and the nursing notes.  Pertinent labs & imaging results that were available during my care of the patient were reviewed by me and considered in my medical decision making (see chart for details).  Clinical Course as of 12/21/20 2030  Wed Dec 21, 2020  2020 Serial troponins are normal [JK]  2021 Prior records reviewed.  Low risk nuclear medicine study December of last year [JK]    Clinical Course User Index [JK] Dorie Rank, MD   MDM Rules/Calculators/A&P HEAR Score: 6                       Patient presented with complaints of chest pain.  Moderate risk heart score.  Patient does have history of prior chest pain and reassuring cardiac work-up.  ED work-up is overall reassuring.  Patient is symptom-free.  Serial troponins are normal.  No signs of acute cardiac ischemia.  No EKG findings.  No signs of pneumonia.   No symptoms to suggest PE.  We will try her on a course of antacids.  Recommend her taking an aspirin daily.  Outpatient follow-up with her cardiologist.  Warning signs precautions discussed    Final Clinical Impression(s) / ED Diagnoses Final diagnoses:  Chest pain, unspecified type    Rx / DC Orders ED Discharge Orders          Ordered    omeprazole (PRILOSEC) 20 MG capsule  Daily        12/21/20 2028             Dorie Rank, MD 12/21/20 2030

## 2021-01-16 ENCOUNTER — Ambulatory Visit (HOSPITAL_COMMUNITY)
Admission: RE | Admit: 2021-01-16 | Discharge: 2021-01-16 | Disposition: A | Discharge status: Home / Self Care | Payer: Medicare HMO | Source: Ambulatory Visit | Attending: Student | Admitting: Student

## 2021-01-16 ENCOUNTER — Other Ambulatory Visit: Payer: Self-pay

## 2021-01-16 DIAGNOSIS — R0609 Other forms of dyspnea: Secondary | ICD-10-CM | POA: Insufficient documentation

## 2021-01-16 LAB — ECHOCARDIOGRAM COMPLETE
AR max vel: 1.86 cm2
AV Area VTI: 1.62 cm2
AV Area mean vel: 1.61 cm2
AV Mean grad: 14 mmHg
AV Peak grad: 25.4 mmHg
Ao pk vel: 2.52 m/s
Area-P 1/2: 2.6 cm2
S' Lateral: 2.4 cm

## 2021-01-16 NOTE — Progress Notes (Signed)
*  PRELIMINARY RESULTS* Echocardiogram 2D Echocardiogram has been performed.  Crystal Fischer 01/16/2021, 2:57 PM

## 2021-01-25 NOTE — Progress Notes (Signed)
Synopsis: Referred for DOE since covid-19 infection by Manon Hilding, MD  Subjective:   PATIENT ID: Crystal Fischer GENDER: female DOB: 1955-07-22, MRN: [id number]  Chief Complaint  Patient presents with   Follow-up    PFT's done today. Breathing has improved some since the last visit but still not able to walk as much as she would like.    66yF with history of nCAD, SCC skin, hypothyroid referred for DOE after covid-19 infection. Never did have asthma as a kid.   6 weeks ago was taking care of mother while she had covid-19.   Prior to this she actually noted some dyspnea when she was walking up incline. After she thinks she may have had covid-19 (never tested positive) her DOE worsened again. Her cough has persisted as well and still has some pleuritic pain esp when coughing. Cough is dry. She does have some swelling in legs which is stable over years. She does not have orthopnea. She doesn't snore loudly. Occasional PND. She does probably have excessive daytime sleepiness.    She has not had course of prednisone recently. She has never had a blood clot before.   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 She smoked 20 years - 1-2 packs per day, quit 27 years ago. 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 Has lived in [place] as well. She has a cat at home.   Interval HPI: Last seen by me 12/07/20. Went to ED 12/21 for exertional CP. Sent home with omeprazole.  DOE comes and goes. A bit better since last visit but still has some DOE. No cough to speak of. She sometimes feels associated right sided chest discomfort and seems to radiate to back.   Albuterol makes her jittery.   Otherwise pertinent review of systems is negative.  Past Medical History:  Diagnosis Date   Coronary atherosclerosis    Nonobstructive by cardiac CTA with FFR May 2021   Essential hypertension     Hypothyroidism    Mixed hyperlipidemia    SCC (squamous cell carcinoma) 12/20/2014   RIGHT CHEEK TX CAURET AND CAUTERY   SCC (squamous cell carcinoma) 01/28/2018   LEFT OUTER CALF TX WITH BX   SCCA (squamous cell carcinoma) of skin 04/25/2020   Anterior Mid Neck (in situ)(CX35FU)   Squamous cell carcinoma of skin 07/28/2014   LEFT POST UPPER ARM  CURET AND CAUTERY     Family History  Problem Relation Age of Onset   Heart attack Father    Cervical cancer Mother    Thyroid disease Neg Hx      Past Surgical History:  Procedure Laterality Date   APPENDECTOMY  1971   CHOLECYSTECTOMY  2006   FOOT SURGERY  06/06/05   bunion   TONSILLECTOMY AND ADENOIDECTOMY     TUBAL LIGATION  1982   URETHRA SURGERY  06-2003   urethral suspension    Social History   Socioeconomic History   Marital status: Divorced    Spouse name: Not on file   Number of children: Not on file   Years of education: Not on file   Highest education level: Not on file  Occupational History   Not on file  Tobacco Use   Smoking status: Former    Packs/day: 2.00    Years: 20.00    Pack years: 40.00  Types: Cigarettes    Start date: 41    Quit date: 07/04/1993    Years since quitting: 27.5   Smokeless tobacco: Never  Vaping Use   Vaping Use: Never used  Substance and Sexual Activity   Alcohol use: Yes    Alcohol/week: 0.0 standard drinks   Drug use: Never   Sexual activity: Not on file  Other Topics Concern   Not on file  Social History Narrative   Not on file   Social Determinants of Health   Financial Resource Strain: Not on file  Food Insecurity: Not on file  Transportation Needs: Not on file  Physical Activity: Not on file  Stress: Not on file  Social Connections: Not on file  Intimate Partner Violence: Not on file     Allergies  Allergen Reactions   Codeine Nausea Only     Outpatient Medications Prior to Visit  Medication Sig Dispense Refill   ALBUTEROL IN Inhale 2 puffs into  the lungs every 4 (four) hours as needed.     aspirin EC 81 MG tablet Take 1 tablet (81 mg total) by mouth daily.     atorvastatin (LIPITOR) 20 MG tablet TAKE 1 TABLET BY MOUTH EVERY DAY 90 tablet 2   furosemide (LASIX) 40 MG tablet Take 40 mg by mouth daily.     ibuprofen (ADVIL) 200 MG tablet Take 800 mg by mouth every 6 (six) hours as needed.     Ivermectin (SOOLANTRA) 1 % CREA Apply 1 application topically daily. 45 g 2   levothyroxine (SYNTHROID) 200 MCG tablet Take 200 mcg by mouth every other day.     levothyroxine (SYNTHROID, LEVOTHROID) 175 MCG tablet Take 175 mcg by mouth every other day.      lisinopril (PRINIVIL,ZESTRIL) 10 MG tablet Take 10 mg by mouth daily.     Magnesium 250 MG TABS Take 250 mg by mouth.     omeprazole (PRILOSEC) 20 MG capsule Take 1 capsule (20 mg total) by mouth daily. 14 capsule 0   TURMERIC PO Take 1 tablet by mouth daily.     zolpidem (AMBIEN) 10 MG tablet Take 1 tablet by mouth at bedtime as needed.  3   No facility-administered medications prior to visit.       Objective:   Physical Exam:  General appearance: 66 y.o., female, NAD, conversant  Eyes: anicteric sclerae; PERRL, tracking appropriately HENT: NCAT; MMM Neck: Trachea midline; no lymphadenopathy, no JVD Lungs: CTAB, no crackles, no wheeze, with normal respiratory effort CV: RRR, +systolic murmur Abdomen: Soft, non-tender; non-distended, BS present  Extremities: No peripheral edema, warm Skin: Normal turgor and texture; no rash Psych: Appropriate affect Neuro: Alert and oriented to person and place, no focal deficit     Vitals:   01/26/21 1137  BP: 108/66  Pulse: 75  Temp: 98.7 F (37.1 C)  TempSrc: Oral  SpO2: 96%  Weight: 200 lb (90.7 kg)  Height: 5\' 3"  (1.6 m)    96% on RA BMI Readings from Last 3 Encounters:  01/26/21 35.43 kg/m  12/21/20 35.43 kg/m  12/07/20 36.14 kg/m   Wt Readings from Last 3 Encounters:  01/26/21 200 lb (90.7 kg)  12/21/20 200 lb (90.7  kg)  12/07/20 204 lb (92.5 kg)     CBC    Component Value Date/Time   WBC 10.1 12/21/2020 1725   RBC 4.08 12/21/2020 1725   HGB 12.3 12/21/2020 1725   HCT 36.8 12/21/2020 1725   PLT 297 12/21/2020 1725  MCV 90.2 12/21/2020 1725   MCH 30.1 12/21/2020 1725   MCHC 33.4 12/21/2020 1725   RDW 14.0 12/21/2020 1725   LYMPHSABS 3.1 12/07/2020 1007   MONOABS 0.7 12/07/2020 1007   EOSABS 0.1 12/07/2020 1007   BASOSABS 0.1 12/07/2020 1007     Chest Imaging: CT Coronaries 05/2019 lung bases reviewed by me unremarkable  CXR 12/07/20 reviewed by me unremarkable  Pulmonary Functions Testing Results: PFT Results Latest Ref Rng & Units 01/26/2021  FVC-Pre L 1.87  FVC-Predicted Pre % 62  FVC-Post L 2.05  FVC-Predicted Post % 69  Pre FEV1/FVC % % 82  Post FEV1/FCV % % 82  FEV1-Pre L 1.53  FEV1-Predicted Pre % 67  FEV1-Post L 1.69  DLCO uncorrected ml/min/mmHg 19.57  DLCO UNC% % 102  DLCO corrected ml/min/mmHg 20.29  DLCO COR %Predicted % 106  DLVA Predicted % 128  TLC L 4.44  TLC % Predicted % 90  RV % Predicted % 115   Reviewed by me with air trapping by reduced FVC/SVC and borderline BD response  Echocardiogram:   TTE 01/16/21: G1DD, mild AS, moderate AV calcification  TTE 04/2019: G1DD, mild AS  Nuke stress 12/2019: Blood pressure demonstrated a hypertensive response to exercise. There was no ST segment deviation noted during stress. The study is normal. There are no perfusion defects This is a low risk study. Duke treadmill score of 7 supports low risk for major cardiac events. The left ventricular ejection fraction is hyperdynamic (>65%).  TTE 01/16/21: G1DD, mild AS     Assessment & Plan:   # DOE: May be multifactorial but differential is broad with possible components of diastolic dysfunction, progressive AS (systolic murmur present today), asthma/covid-19 related reactive airways (+air trapping on PFT and borderline BD response).   Plan: - stiolto 2 puffs once  daily, instructed in use - if too jittery with this we will try ICS/LABA - if cough becomes bothersome, discuss transition off ACEi next visit - discuss whether to pursue sleep study next visit  RTC 3 months   Maryjane Hurter, MD [facility] 01/26/2021 11:43 AM

## 2021-01-26 ENCOUNTER — Ambulatory Visit (INDEPENDENT_AMBULATORY_CARE_PROVIDER_SITE_OTHER): Payer: Medicare HMO | Admitting: Student

## 2021-01-26 ENCOUNTER — Encounter: Payer: Self-pay | Admitting: Student

## 2021-01-26 ENCOUNTER — Other Ambulatory Visit: Payer: Self-pay

## 2021-01-26 VITALS — BP 108/66 | HR 75 | Temp 98.7°F | Ht 63.0 in | Wt 200.0 lb

## 2021-01-26 DIAGNOSIS — R0609 Other forms of dyspnea: Secondary | ICD-10-CM | POA: Diagnosis not present

## 2021-01-26 DIAGNOSIS — J4540 Moderate persistent asthma, uncomplicated: Secondary | ICD-10-CM | POA: Diagnosis not present

## 2021-01-26 LAB — PULMONARY FUNCTION TEST
DL/VA % pred: 128 %
DL/VA: 5.36 ml/min/mmHg/L
DLCO cor % pred: 106 %
DLCO cor: 20.29 ml/min/mmHg
DLCO unc % pred: 102 %
DLCO unc: 19.57 ml/min/mmHg
FEF 25-75 Post: 2.37 L/sec
FEF 25-75 Pre: 1.47 L/sec
FEF2575-%Change-Post: 61 %
FEF2575-%Pred-Post: 119 %
FEF2575-%Pred-Pre: 74 %
FEV1-%Change-Post: 10 %
FEV1-%Pred-Post: 74 %
FEV1-%Pred-Pre: 67 %
FEV1-Post: 1.69 L
FEV1-Pre: 1.53 L
FEV1FVC-%Change-Post: 0 %
FEV1FVC-%Pred-Pre: 106 %
FEV6-%Change-Post: 10 %
FEV6-%Pred-Post: 72 %
FEV6-%Pred-Pre: 65 %
FEV6-Post: 2.05 L
FEV6-Pre: 1.87 L
FEV6FVC-%Pred-Post: 104 %
FEV6FVC-%Pred-Pre: 104 %
FVC-%Change-Post: 10 %
FVC-%Pred-Post: 69 %
FVC-%Pred-Pre: 62 %
FVC-Post: 2.05 L
FVC-Pre: 1.87 L
Post FEV1/FVC ratio: 82 %
Post FEV6/FVC ratio: 100 %
Pre FEV1/FVC ratio: 82 %
Pre FEV6/FVC Ratio: 100 %
RV % pred: 115 %
RV: 2.41 L
TLC % pred: 90 %
TLC: 4.44 L

## 2021-01-26 MED ORDER — STIOLTO RESPIMAT 2.5-2.5 MCG/ACT IN AERS: 2.0000 | INHALATION_SPRAY | Freq: Every day | RESPIRATORY_TRACT | 0 refills | Status: DC

## 2021-01-26 NOTE — Patient Instructions (Signed)
-   stiolto 2 puffs daily - let me know if you feel too jittery using this medication - send my chart message or call - we will try to figure out which inhaler in this class is most affordable for you if you're doing well on it - see you in 3 months!

## 2021-01-26 NOTE — Addendum Note (Signed)
Addended by: Rosana Berger on: 01/26/2021 04:40 PM   Modules accepted: Orders

## 2021-01-26 NOTE — Progress Notes (Signed)
PFT done today. 

## 2021-01-27 ENCOUNTER — Telehealth: Payer: Self-pay | Admitting: *Deleted

## 2021-01-27 ENCOUNTER — Other Ambulatory Visit (HOSPITAL_COMMUNITY): Payer: Self-pay

## 2021-01-27 NOTE — Telephone Encounter (Signed)
Baker, Monchell R, CPhT  Rosana Berger, CMA Replies will be sent to P Rx Prior Auth Team Caller: Unspecified (Today,  2:13 PM) Ran [employer] test claim and current co pays are as follows:  Anoro- $47  Bevespi- $47  Stiolto- requires a PA

## 2021-01-27 NOTE — Telephone Encounter (Signed)
Forwarding to pharm team to see best option for laba/lama, thanks!

## 2021-01-27 NOTE — Telephone Encounter (Signed)
Ran [employer]OP test claim and current co pays are as follows: Anoro- $47 Bevespi- $47 Stiolto- requires a PA

## 2021-01-27 NOTE — Telephone Encounter (Signed)
-----   Message from Maryjane Hurter, MD sent at 01/26/2021  6:08 PM EST ----- Regarding: laba lama Can you ask pharmacy which laba/lama cheapest option for her?  Thanks!! Nate

## 2021-01-30 ENCOUNTER — Encounter: Payer: Self-pay | Admitting: Student

## 2021-01-30 NOTE — Telephone Encounter (Signed)
Message received for you this afternoon-  I started using the inhaler, Stiolto Respimat, you gave me on Friday thru Sunday. I have broken out in hives and itching really bad. The inhaler is the only change to my routine. I stopped using it and am taking Benadryl. I used my old inhaler, albuterol, this morning and I seemed to do fine with it. Any suggestions?   Message routed to Dr. Verlee Monte to advise

## 2021-01-30 NOTE — Telephone Encounter (Signed)
Called and discussed. Dyspnea, rash subsiding. No GI issues. She declines trial of ICS/LABA, will just try albuterol prn. Given ED precautions for onset of anaphylaxis but sounds like this is getting better.

## 2021-02-01 ENCOUNTER — Other Ambulatory Visit: Payer: Self-pay | Admitting: Cardiology

## 2021-04-20 DIAGNOSIS — M5412 Radiculopathy, cervical region: Secondary | ICD-10-CM | POA: Diagnosis not present

## 2021-04-25 ENCOUNTER — Encounter: Payer: Self-pay | Admitting: Dermatology

## 2021-04-25 ENCOUNTER — Ambulatory Visit (INDEPENDENT_AMBULATORY_CARE_PROVIDER_SITE_OTHER): Payer: Medicare HMO | Admitting: Dermatology

## 2021-04-25 DIAGNOSIS — L570 Actinic keratosis: Secondary | ICD-10-CM | POA: Diagnosis not present

## 2021-04-25 DIAGNOSIS — Z85828 Personal history of other malignant neoplasm of skin: Secondary | ICD-10-CM | POA: Diagnosis not present

## 2021-04-25 DIAGNOSIS — L821 Other seborrheic keratosis: Secondary | ICD-10-CM

## 2021-04-25 DIAGNOSIS — Z1283 Encounter for screening for malignant neoplasm of skin: Secondary | ICD-10-CM

## 2021-04-26 ENCOUNTER — Ambulatory Visit: Payer: Medicare HMO | Admitting: Student

## 2021-05-01 DIAGNOSIS — M19032 Primary osteoarthritis, left wrist: Secondary | ICD-10-CM | POA: Diagnosis not present

## 2021-05-01 DIAGNOSIS — Z6835 Body mass index (BMI) 35.0-35.9, adult: Secondary | ICD-10-CM | POA: Diagnosis not present

## 2021-05-03 DIAGNOSIS — M5412 Radiculopathy, cervical region: Secondary | ICD-10-CM | POA: Diagnosis not present

## 2021-05-12 ENCOUNTER — Encounter: Payer: Self-pay | Admitting: Dermatology

## 2021-05-12 NOTE — Progress Notes (Signed)
? ?  Follow-Up Visit ?  ?Subjective  ?Crystal Fischer is a 66 y.o. female who presents for the following: Annual Exam (Patient here today for yearly skin check, per patient she has a area on her right parotid area x several months that does get flaky, comes and goes no bleeding. Personal history of non mole skin cancer. No family history of atypical moles, melanoma or non mole skin cancer. ). ? ?Annual skin check, concerned about scale on cheek ?Location:  ?Duration:  ?Quality:  ?Associated Signs/Symptoms: ?Modifying Factors:  ?Severity:  ?Timing: ?Context:  ? ?Objective  ?Well appearing patient in no apparent distress; mood and affect are within normal limits. ?Head to toe ?No atypical nevi or signs of NMSC noted at the time of the visit.  ? ?Left Breast, Torso - Posterior (Back) ?Textured flattopped 4 to 8 mm tan/brown papules, typical dermoscopy ? ?Right Parotid Area ?Gritty pink 5 mm crust ? ? ? ?A full examination was performed including scalp, head, eyes, ears, nose, lips, neck, chest, axillae, abdomen, back, buttocks, bilateral upper extremities, bilateral lower extremities, hands, feet, fingers, toes, fingernails, and toenails. All findings within normal limits unless otherwise noted below.  Areas beneath undergarments not fully examined. ? ? ?Assessment & Plan  ? ? ?Seborrheic keratosis (2) ?Left Breast; Torso - Posterior (Back) ? ?Benign okay to leave if stable ? ?Skin exam for malignant neoplasm ?Head to toe ? ?Yearly skin check.,  Encouraged to self examine twice annually.  Continued ultraviolet protection ? ?AK (actinic keratosis) ?Right Parotid Area ? ?Return if the area is not smooth with in 1 month. ? ?Destruction of lesion - Right Parotid Area ?Complexity: simple   ?Destruction method: cryotherapy   ?Informed consent: discussed and consent obtained   ?Timeout:  patient name, date of birth, surgical site, and procedure verified ?Lesion destroyed using liquid nitrogen: Yes   ?Region frozen until ice ball  extended beyond lesion: Yes   ?Cryotherapy cycles:  6 ?Outcome: patient tolerated procedure well with no complications   ?Post-procedure details: wound care instructions given   ? ? ? ? ? ?I, Lavonna Monarch, MD, have reviewed all documentation for this visit.  The documentation on 05/12/21 for the exam, diagnosis, procedures, and orders are all accurate and complete. ?

## 2021-05-16 DIAGNOSIS — Z823 Family history of stroke: Secondary | ICD-10-CM | POA: Diagnosis not present

## 2021-05-16 DIAGNOSIS — R600 Localized edema: Secondary | ICD-10-CM | POA: Diagnosis not present

## 2021-05-16 DIAGNOSIS — E039 Hypothyroidism, unspecified: Secondary | ICD-10-CM | POA: Diagnosis not present

## 2021-05-16 DIAGNOSIS — R32 Unspecified urinary incontinence: Secondary | ICD-10-CM | POA: Diagnosis not present

## 2021-05-16 DIAGNOSIS — Z809 Family history of malignant neoplasm, unspecified: Secondary | ICD-10-CM | POA: Diagnosis not present

## 2021-05-16 DIAGNOSIS — J45909 Unspecified asthma, uncomplicated: Secondary | ICD-10-CM | POA: Diagnosis not present

## 2021-05-16 DIAGNOSIS — Z791 Long term (current) use of non-steroidal anti-inflammatories (NSAID): Secondary | ICD-10-CM | POA: Diagnosis not present

## 2021-05-16 DIAGNOSIS — Z008 Encounter for other general examination: Secondary | ICD-10-CM | POA: Diagnosis not present

## 2021-05-16 DIAGNOSIS — Z6836 Body mass index (BMI) 36.0-36.9, adult: Secondary | ICD-10-CM | POA: Diagnosis not present

## 2021-05-16 DIAGNOSIS — Z811 Family history of alcohol abuse and dependence: Secondary | ICD-10-CM | POA: Diagnosis not present

## 2021-05-16 DIAGNOSIS — E785 Hyperlipidemia, unspecified: Secondary | ICD-10-CM | POA: Diagnosis not present

## 2021-05-16 DIAGNOSIS — I10 Essential (primary) hypertension: Secondary | ICD-10-CM | POA: Diagnosis not present

## 2021-05-17 DIAGNOSIS — Z1231 Encounter for screening mammogram for malignant neoplasm of breast: Secondary | ICD-10-CM | POA: Diagnosis not present

## 2021-05-18 DIAGNOSIS — M5412 Radiculopathy, cervical region: Secondary | ICD-10-CM | POA: Diagnosis not present

## 2021-05-24 DIAGNOSIS — R7301 Impaired fasting glucose: Secondary | ICD-10-CM | POA: Diagnosis not present

## 2021-05-24 DIAGNOSIS — E039 Hypothyroidism, unspecified: Secondary | ICD-10-CM | POA: Diagnosis not present

## 2021-05-24 DIAGNOSIS — E7849 Other hyperlipidemia: Secondary | ICD-10-CM | POA: Diagnosis not present

## 2021-05-24 DIAGNOSIS — E0580 Other thyrotoxicosis without thyrotoxic crisis or storm: Secondary | ICD-10-CM | POA: Diagnosis not present

## 2021-05-24 DIAGNOSIS — I10 Essential (primary) hypertension: Secondary | ICD-10-CM | POA: Diagnosis not present

## 2021-05-24 DIAGNOSIS — E7801 Familial hypercholesterolemia: Secondary | ICD-10-CM | POA: Diagnosis not present

## 2021-05-24 DIAGNOSIS — R7303 Prediabetes: Secondary | ICD-10-CM | POA: Diagnosis not present

## 2021-05-24 DIAGNOSIS — E782 Mixed hyperlipidemia: Secondary | ICD-10-CM | POA: Diagnosis not present

## 2021-05-24 DIAGNOSIS — E7800 Pure hypercholesterolemia, unspecified: Secondary | ICD-10-CM | POA: Diagnosis not present

## 2021-06-01 DIAGNOSIS — Z23 Encounter for immunization: Secondary | ICD-10-CM | POA: Diagnosis not present

## 2021-06-01 DIAGNOSIS — Z1331 Encounter for screening for depression: Secondary | ICD-10-CM | POA: Diagnosis not present

## 2021-06-01 DIAGNOSIS — R7303 Prediabetes: Secondary | ICD-10-CM | POA: Diagnosis not present

## 2021-06-01 DIAGNOSIS — G5603 Carpal tunnel syndrome, bilateral upper limbs: Secondary | ICD-10-CM | POA: Diagnosis not present

## 2021-06-01 DIAGNOSIS — L732 Hidradenitis suppurativa: Secondary | ICD-10-CM | POA: Diagnosis not present

## 2021-06-01 DIAGNOSIS — R5383 Other fatigue: Secondary | ICD-10-CM | POA: Diagnosis not present

## 2021-06-01 DIAGNOSIS — R7301 Impaired fasting glucose: Secondary | ICD-10-CM | POA: Diagnosis not present

## 2021-06-01 DIAGNOSIS — I10 Essential (primary) hypertension: Secondary | ICD-10-CM | POA: Diagnosis not present

## 2021-06-01 DIAGNOSIS — E038 Other specified hypothyroidism: Secondary | ICD-10-CM | POA: Diagnosis not present

## 2021-06-01 DIAGNOSIS — M542 Cervicalgia: Secondary | ICD-10-CM | POA: Diagnosis not present

## 2021-06-01 DIAGNOSIS — E7849 Other hyperlipidemia: Secondary | ICD-10-CM | POA: Diagnosis not present

## 2021-06-01 DIAGNOSIS — Z1389 Encounter for screening for other disorder: Secondary | ICD-10-CM | POA: Diagnosis not present

## 2021-07-17 ENCOUNTER — Ambulatory Visit: Payer: Medicare HMO | Admitting: Dermatology

## 2021-07-19 DIAGNOSIS — M180 Bilateral primary osteoarthritis of first carpometacarpal joints: Secondary | ICD-10-CM | POA: Diagnosis not present

## 2021-07-19 DIAGNOSIS — G5603 Carpal tunnel syndrome, bilateral upper limbs: Secondary | ICD-10-CM | POA: Diagnosis not present

## 2021-08-02 DIAGNOSIS — G5603 Carpal tunnel syndrome, bilateral upper limbs: Secondary | ICD-10-CM | POA: Diagnosis not present

## 2021-08-07 DIAGNOSIS — G5603 Carpal tunnel syndrome, bilateral upper limbs: Secondary | ICD-10-CM | POA: Diagnosis not present

## 2021-08-07 DIAGNOSIS — G5601 Carpal tunnel syndrome, right upper limb: Secondary | ICD-10-CM | POA: Diagnosis not present

## 2021-08-25 DIAGNOSIS — G5601 Carpal tunnel syndrome, right upper limb: Secondary | ICD-10-CM | POA: Diagnosis not present

## 2021-09-15 DIAGNOSIS — J00 Acute nasopharyngitis [common cold]: Secondary | ICD-10-CM | POA: Diagnosis not present

## 2021-09-15 DIAGNOSIS — J069 Acute upper respiratory infection, unspecified: Secondary | ICD-10-CM | POA: Diagnosis not present

## 2021-09-21 DIAGNOSIS — J069 Acute upper respiratory infection, unspecified: Secondary | ICD-10-CM | POA: Diagnosis not present

## 2021-09-21 DIAGNOSIS — Z20828 Contact with and (suspected) exposure to other viral communicable diseases: Secondary | ICD-10-CM | POA: Diagnosis not present

## 2021-09-21 DIAGNOSIS — Z6837 Body mass index (BMI) 37.0-37.9, adult: Secondary | ICD-10-CM | POA: Diagnosis not present

## 2021-10-02 DIAGNOSIS — R7303 Prediabetes: Secondary | ICD-10-CM | POA: Diagnosis not present

## 2021-10-02 DIAGNOSIS — I10 Essential (primary) hypertension: Secondary | ICD-10-CM | POA: Diagnosis not present

## 2021-10-02 DIAGNOSIS — E7801 Familial hypercholesterolemia: Secondary | ICD-10-CM | POA: Diagnosis not present

## 2021-10-02 DIAGNOSIS — E039 Hypothyroidism, unspecified: Secondary | ICD-10-CM | POA: Diagnosis not present

## 2021-10-02 DIAGNOSIS — E7849 Other hyperlipidemia: Secondary | ICD-10-CM | POA: Diagnosis not present

## 2021-10-05 DIAGNOSIS — E038 Other specified hypothyroidism: Secondary | ICD-10-CM | POA: Diagnosis not present

## 2021-10-05 DIAGNOSIS — E7849 Other hyperlipidemia: Secondary | ICD-10-CM | POA: Diagnosis not present

## 2021-10-05 DIAGNOSIS — E1140 Type 2 diabetes mellitus with diabetic neuropathy, unspecified: Secondary | ICD-10-CM | POA: Diagnosis not present

## 2021-10-05 DIAGNOSIS — Z23 Encounter for immunization: Secondary | ICD-10-CM | POA: Diagnosis not present

## 2021-10-05 DIAGNOSIS — R5383 Other fatigue: Secondary | ICD-10-CM | POA: Diagnosis not present

## 2021-10-05 DIAGNOSIS — I10 Essential (primary) hypertension: Secondary | ICD-10-CM | POA: Diagnosis not present

## 2021-10-05 DIAGNOSIS — G5600 Carpal tunnel syndrome, unspecified upper limb: Secondary | ICD-10-CM | POA: Diagnosis not present

## 2021-10-05 DIAGNOSIS — R252 Cramp and spasm: Secondary | ICD-10-CM | POA: Diagnosis not present

## 2021-10-05 DIAGNOSIS — R7303 Prediabetes: Secondary | ICD-10-CM | POA: Diagnosis not present

## 2021-10-05 DIAGNOSIS — L732 Hidradenitis suppurativa: Secondary | ICD-10-CM | POA: Diagnosis not present

## 2021-10-05 DIAGNOSIS — R7301 Impaired fasting glucose: Secondary | ICD-10-CM | POA: Diagnosis not present

## 2021-10-05 DIAGNOSIS — M542 Cervicalgia: Secondary | ICD-10-CM | POA: Diagnosis not present

## 2021-11-30 DIAGNOSIS — E782 Mixed hyperlipidemia: Secondary | ICD-10-CM | POA: Diagnosis not present

## 2021-11-30 DIAGNOSIS — E039 Hypothyroidism, unspecified: Secondary | ICD-10-CM | POA: Diagnosis not present

## 2021-12-20 DIAGNOSIS — F419 Anxiety disorder, unspecified: Secondary | ICD-10-CM | POA: Diagnosis not present

## 2021-12-20 DIAGNOSIS — R69 Illness, unspecified: Secondary | ICD-10-CM | POA: Diagnosis not present

## 2021-12-20 DIAGNOSIS — Z6836 Body mass index (BMI) 36.0-36.9, adult: Secondary | ICD-10-CM | POA: Diagnosis not present

## 2021-12-20 DIAGNOSIS — R030 Elevated blood-pressure reading, without diagnosis of hypertension: Secondary | ICD-10-CM | POA: Diagnosis not present

## 2022-01-08 DIAGNOSIS — Z0100 Encounter for examination of eyes and vision without abnormal findings: Secondary | ICD-10-CM | POA: Diagnosis not present

## 2022-01-08 DIAGNOSIS — H524 Presbyopia: Secondary | ICD-10-CM | POA: Diagnosis not present

## 2022-01-08 DIAGNOSIS — H3562 Retinal hemorrhage, left eye: Secondary | ICD-10-CM | POA: Diagnosis not present

## 2022-01-12 DIAGNOSIS — R350 Frequency of micturition: Secondary | ICD-10-CM | POA: Diagnosis not present

## 2022-01-12 DIAGNOSIS — N390 Urinary tract infection, site not specified: Secondary | ICD-10-CM | POA: Diagnosis not present

## 2022-01-22 IMAGING — DX DG CHEST 2V
2 series · 2 of 2 positions shown · non-contrast
Comparison: X-ray chest 12/07/2009.

CLINICAL DATA: Dyspnea on exertion.

EXAM:
CHEST - 2 VIEW

[chest pa]
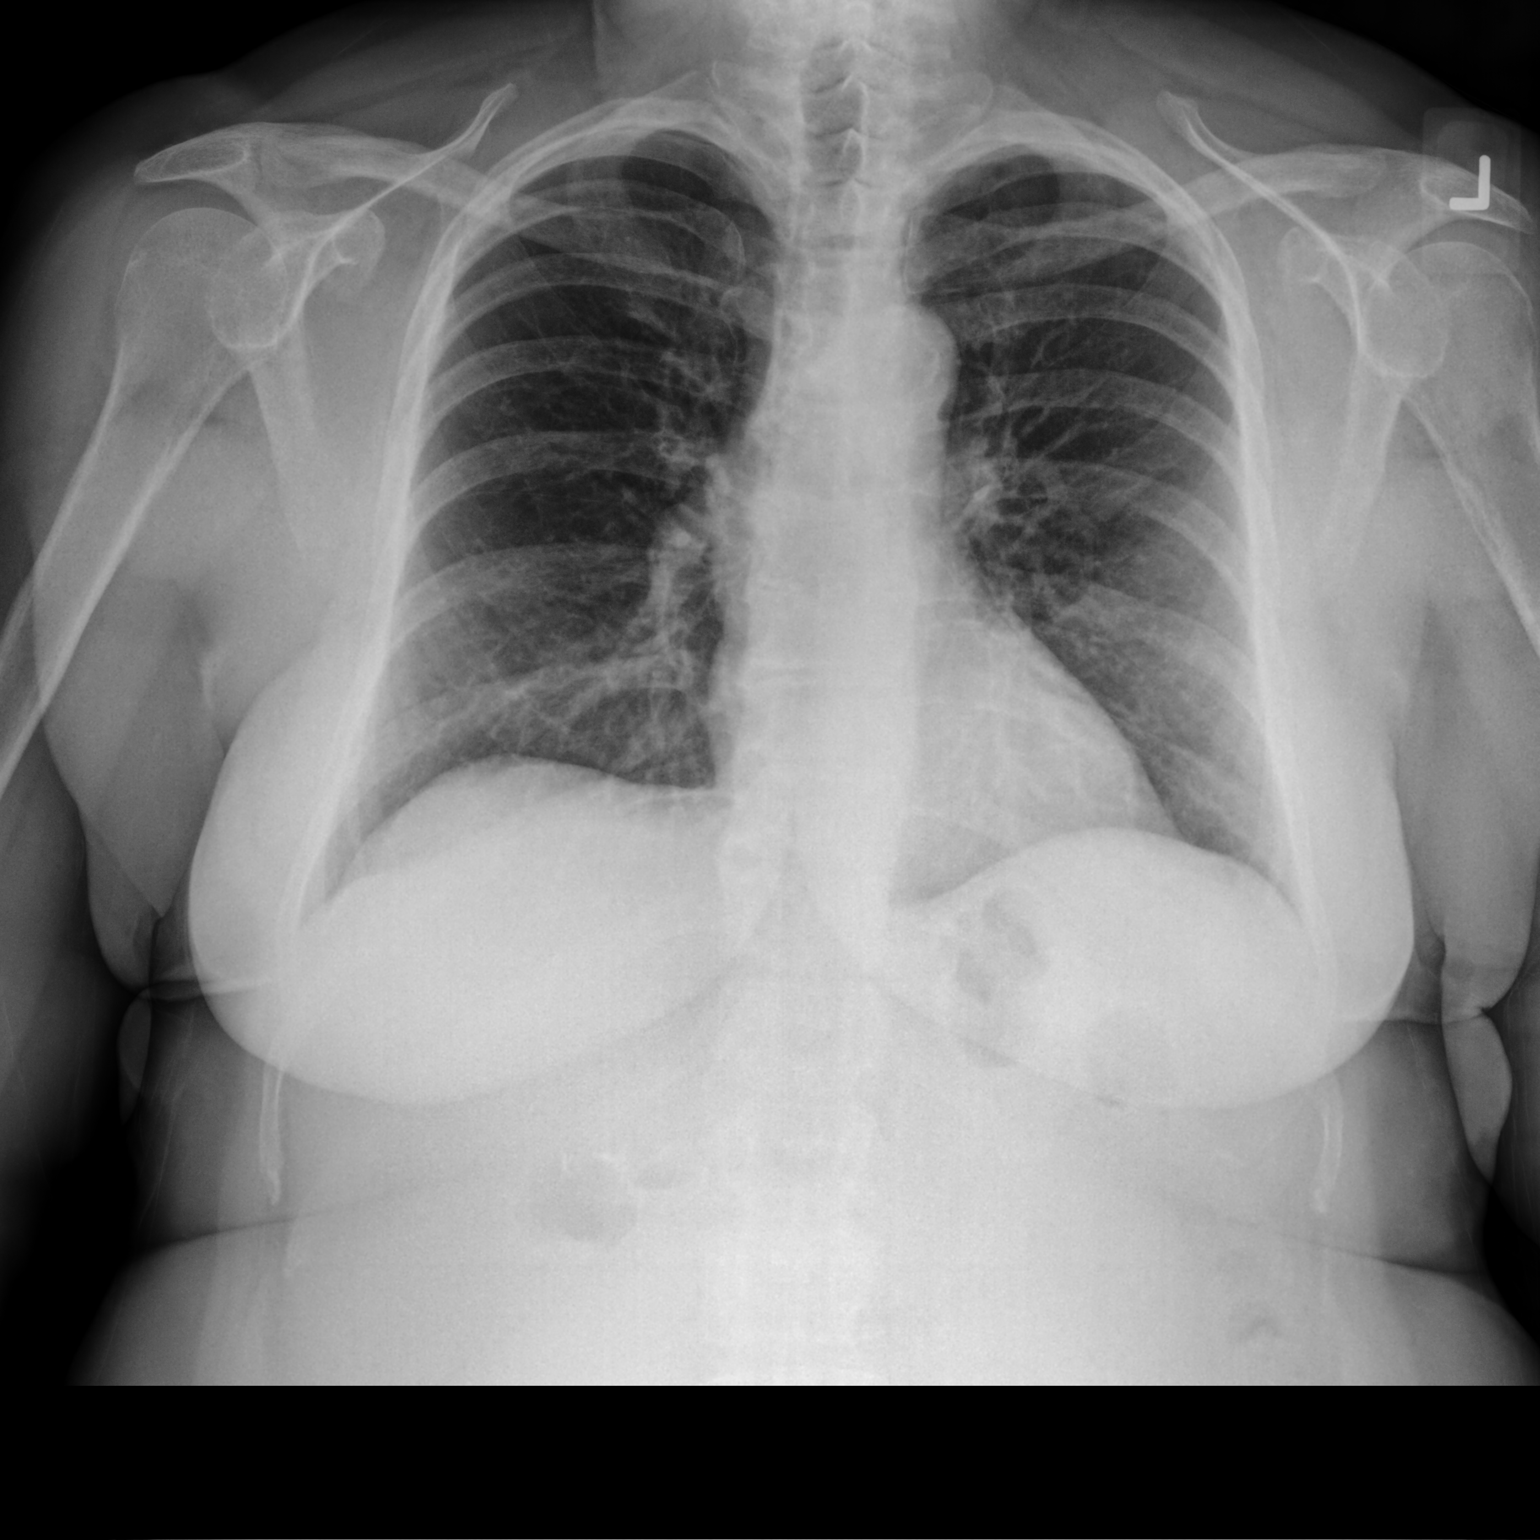

[chest lat]
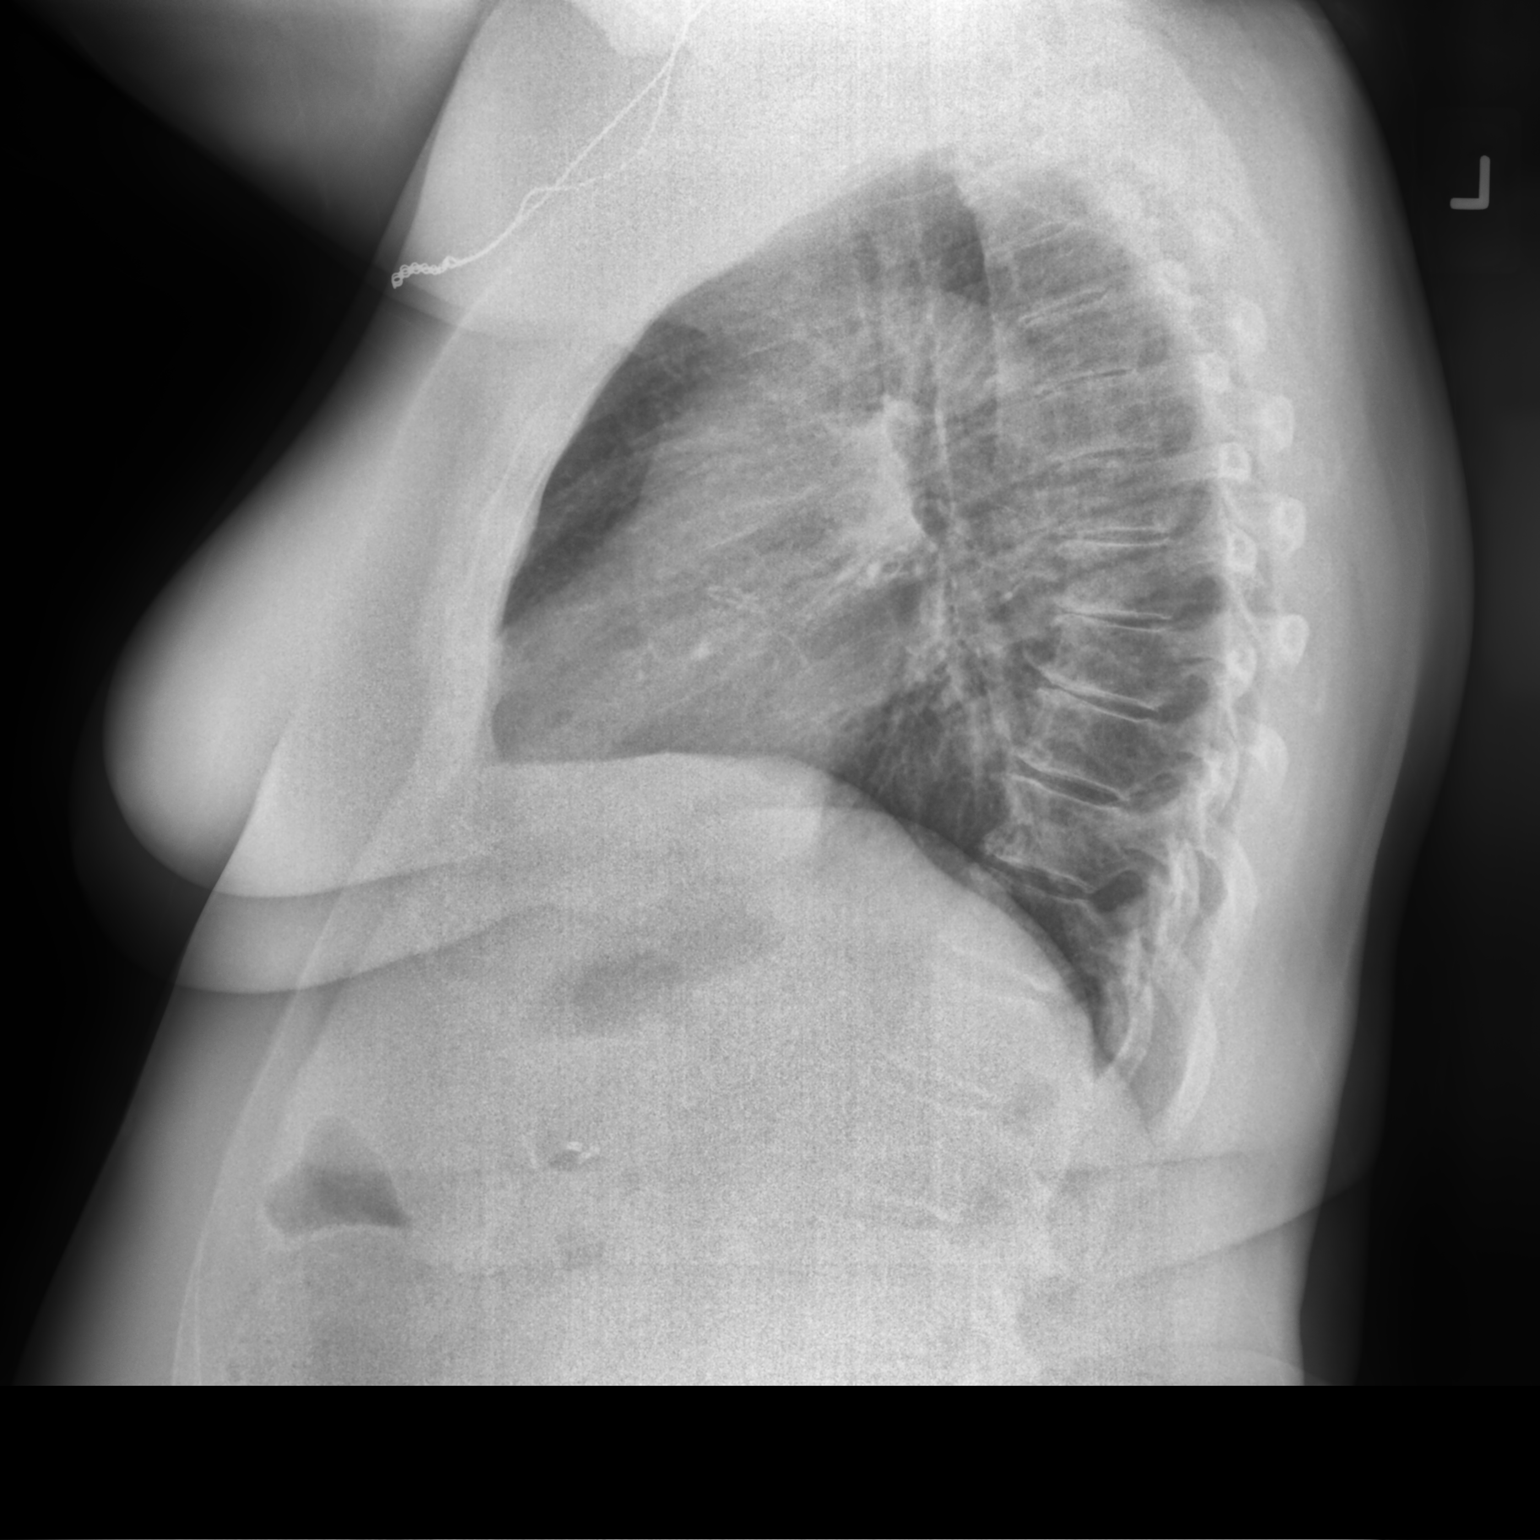

[2 of 2 positions shown; findings below may reference images not displayed]

FINDINGS: The heart size and mediastinal contours are within normal limits.
Atherosclerotic calcification of the aortic knob. No focal airspace
consolidation, pleural effusion, or pneumothorax. The visualized
skeletal structures are unremarkable.
IMPRESSION: No active cardiopulmonary disease.

## 2022-01-23 DIAGNOSIS — R030 Elevated blood-pressure reading, without diagnosis of hypertension: Secondary | ICD-10-CM | POA: Diagnosis not present

## 2022-01-23 DIAGNOSIS — Z6836 Body mass index (BMI) 36.0-36.9, adult: Secondary | ICD-10-CM | POA: Diagnosis not present

## 2022-01-23 DIAGNOSIS — N390 Urinary tract infection, site not specified: Secondary | ICD-10-CM | POA: Diagnosis not present

## 2022-01-31 DIAGNOSIS — I10 Essential (primary) hypertension: Secondary | ICD-10-CM | POA: Diagnosis not present

## 2022-01-31 DIAGNOSIS — E782 Mixed hyperlipidemia: Secondary | ICD-10-CM | POA: Diagnosis not present

## 2022-01-31 DIAGNOSIS — E7849 Other hyperlipidemia: Secondary | ICD-10-CM | POA: Diagnosis not present

## 2022-01-31 DIAGNOSIS — R7301 Impaired fasting glucose: Secondary | ICD-10-CM | POA: Diagnosis not present

## 2022-01-31 DIAGNOSIS — R5383 Other fatigue: Secondary | ICD-10-CM | POA: Diagnosis not present

## 2022-01-31 DIAGNOSIS — R300 Dysuria: Secondary | ICD-10-CM | POA: Diagnosis not present

## 2022-01-31 DIAGNOSIS — E038 Other specified hypothyroidism: Secondary | ICD-10-CM | POA: Diagnosis not present

## 2022-02-05 IMAGING — DX DG CHEST 1V PORT
1 series · 1 of 1 positions shown · non-contrast
Comparison: Chest radiograph dated December 07, 2020

CLINICAL DATA: Chest pain

EXAM:
PORTABLE CHEST 1 VIEW

[chest ap]
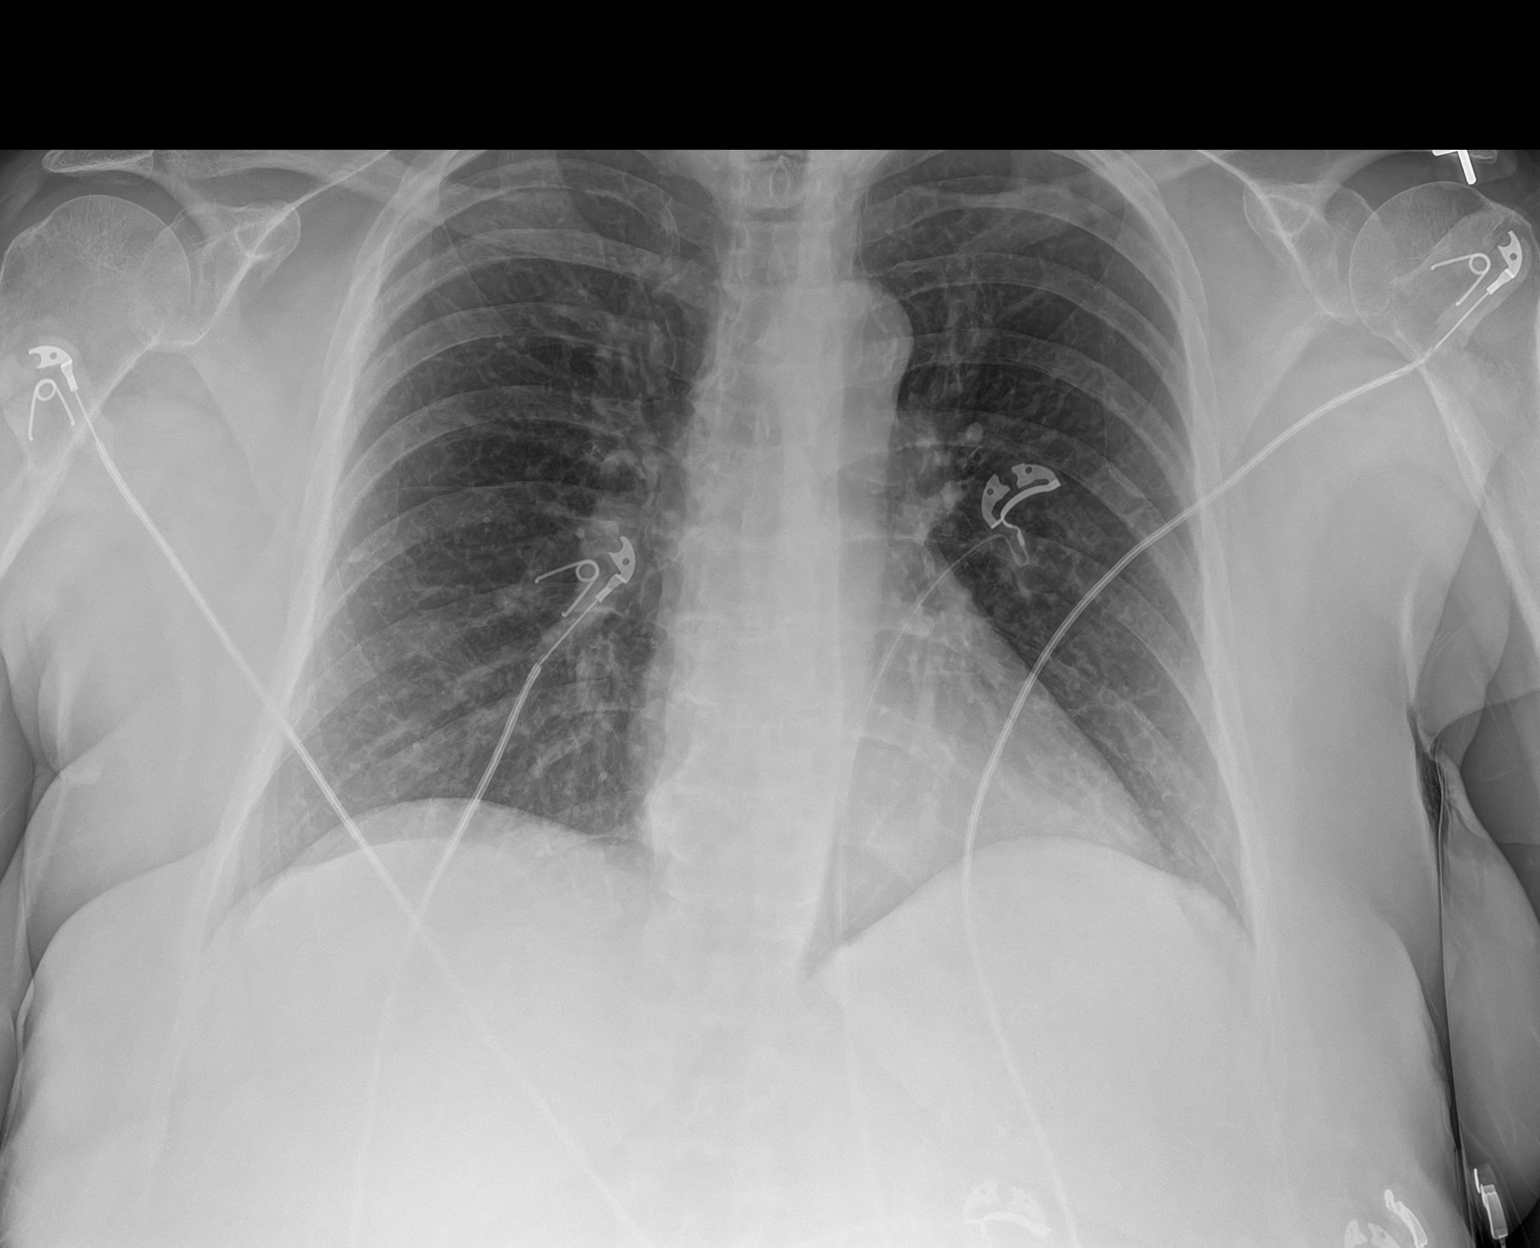

[1 of 1 positions shown; findings below may reference images not displayed]

FINDINGS: The heart size and mediastinal contours are within normal limits.
Both lungs are clear. The visualized skeletal structures are
unremarkable.
IMPRESSION: No active disease.

## 2022-02-07 DIAGNOSIS — I10 Essential (primary) hypertension: Secondary | ICD-10-CM | POA: Diagnosis not present

## 2022-02-07 DIAGNOSIS — Z23 Encounter for immunization: Secondary | ICD-10-CM | POA: Diagnosis not present

## 2022-02-07 DIAGNOSIS — Z6837 Body mass index (BMI) 37.0-37.9, adult: Secondary | ICD-10-CM | POA: Diagnosis not present

## 2022-02-07 DIAGNOSIS — E038 Other specified hypothyroidism: Secondary | ICD-10-CM | POA: Diagnosis not present

## 2022-02-07 DIAGNOSIS — M542 Cervicalgia: Secondary | ICD-10-CM | POA: Diagnosis not present

## 2022-02-07 DIAGNOSIS — L732 Hidradenitis suppurativa: Secondary | ICD-10-CM | POA: Diagnosis not present

## 2022-02-07 DIAGNOSIS — G5603 Carpal tunnel syndrome, bilateral upper limbs: Secondary | ICD-10-CM | POA: Diagnosis not present

## 2022-02-07 DIAGNOSIS — R7301 Impaired fasting glucose: Secondary | ICD-10-CM | POA: Diagnosis not present

## 2022-02-07 DIAGNOSIS — E1140 Type 2 diabetes mellitus with diabetic neuropathy, unspecified: Secondary | ICD-10-CM | POA: Diagnosis not present

## 2022-02-07 DIAGNOSIS — R5383 Other fatigue: Secondary | ICD-10-CM | POA: Diagnosis not present

## 2022-02-07 DIAGNOSIS — E7849 Other hyperlipidemia: Secondary | ICD-10-CM | POA: Diagnosis not present

## 2022-02-07 DIAGNOSIS — R252 Cramp and spasm: Secondary | ICD-10-CM | POA: Diagnosis not present

## 2022-02-22 DIAGNOSIS — M5412 Radiculopathy, cervical region: Secondary | ICD-10-CM | POA: Diagnosis not present

## 2022-02-22 DIAGNOSIS — M47812 Spondylosis without myelopathy or radiculopathy, cervical region: Secondary | ICD-10-CM | POA: Diagnosis not present

## 2022-03-04 DIAGNOSIS — R110 Nausea: Secondary | ICD-10-CM | POA: Diagnosis not present

## 2022-03-04 DIAGNOSIS — Z6836 Body mass index (BMI) 36.0-36.9, adult: Secondary | ICD-10-CM | POA: Diagnosis not present

## 2022-03-04 DIAGNOSIS — M1991 Primary osteoarthritis, unspecified site: Secondary | ICD-10-CM | POA: Diagnosis not present

## 2022-03-04 DIAGNOSIS — E669 Obesity, unspecified: Secondary | ICD-10-CM | POA: Diagnosis not present

## 2022-03-04 DIAGNOSIS — Z79899 Other long term (current) drug therapy: Secondary | ICD-10-CM | POA: Diagnosis not present

## 2022-03-04 DIAGNOSIS — I10 Essential (primary) hypertension: Secondary | ICD-10-CM | POA: Diagnosis not present

## 2022-03-04 DIAGNOSIS — G319 Degenerative disease of nervous system, unspecified: Secondary | ICD-10-CM | POA: Diagnosis not present

## 2022-03-04 DIAGNOSIS — I6782 Cerebral ischemia: Secondary | ICD-10-CM | POA: Diagnosis not present

## 2022-03-04 DIAGNOSIS — Z20822 Contact with and (suspected) exposure to covid-19: Secondary | ICD-10-CM | POA: Diagnosis not present

## 2022-03-04 DIAGNOSIS — E7800 Pure hypercholesterolemia, unspecified: Secondary | ICD-10-CM | POA: Diagnosis not present

## 2022-03-04 DIAGNOSIS — I6381 Other cerebral infarction due to occlusion or stenosis of small artery: Secondary | ICD-10-CM | POA: Diagnosis not present

## 2022-03-04 DIAGNOSIS — E039 Hypothyroidism, unspecified: Secondary | ICD-10-CM | POA: Diagnosis not present

## 2022-03-04 DIAGNOSIS — G8929 Other chronic pain: Secondary | ICD-10-CM | POA: Diagnosis not present

## 2022-03-04 DIAGNOSIS — Z1152 Encounter for screening for COVID-19: Secondary | ICD-10-CM | POA: Diagnosis not present

## 2022-03-04 DIAGNOSIS — M542 Cervicalgia: Secondary | ICD-10-CM | POA: Diagnosis not present

## 2022-03-04 DIAGNOSIS — Z79890 Hormone replacement therapy (postmenopausal): Secondary | ICD-10-CM | POA: Diagnosis not present

## 2022-03-04 DIAGNOSIS — R519 Headache, unspecified: Secondary | ICD-10-CM | POA: Diagnosis not present

## 2022-03-04 DIAGNOSIS — R42 Dizziness and giddiness: Secondary | ICD-10-CM | POA: Diagnosis not present

## 2022-03-05 DIAGNOSIS — H3562 Retinal hemorrhage, left eye: Secondary | ICD-10-CM | POA: Diagnosis not present

## 2022-03-15 DIAGNOSIS — M47812 Spondylosis without myelopathy or radiculopathy, cervical region: Secondary | ICD-10-CM | POA: Diagnosis not present

## 2022-03-19 DIAGNOSIS — R69 Illness, unspecified: Secondary | ICD-10-CM | POA: Diagnosis not present

## 2022-04-03 DIAGNOSIS — M47812 Spondylosis without myelopathy or radiculopathy, cervical region: Secondary | ICD-10-CM | POA: Diagnosis not present

## 2022-04-03 DIAGNOSIS — M5412 Radiculopathy, cervical region: Secondary | ICD-10-CM | POA: Diagnosis not present

## 2022-04-12 DIAGNOSIS — E0590 Thyrotoxicosis, unspecified without thyrotoxic crisis or storm: Secondary | ICD-10-CM | POA: Diagnosis not present

## 2022-04-19 DIAGNOSIS — M542 Cervicalgia: Secondary | ICD-10-CM | POA: Diagnosis not present

## 2022-04-19 DIAGNOSIS — R030 Elevated blood-pressure reading, without diagnosis of hypertension: Secondary | ICD-10-CM | POA: Diagnosis not present

## 2022-04-19 DIAGNOSIS — E0590 Thyrotoxicosis, unspecified without thyrotoxic crisis or storm: Secondary | ICD-10-CM | POA: Diagnosis not present

## 2022-04-19 DIAGNOSIS — Z6834 Body mass index (BMI) 34.0-34.9, adult: Secondary | ICD-10-CM | POA: Diagnosis not present

## 2022-04-19 DIAGNOSIS — L259 Unspecified contact dermatitis, unspecified cause: Secondary | ICD-10-CM | POA: Diagnosis not present

## 2022-04-19 DIAGNOSIS — R519 Headache, unspecified: Secondary | ICD-10-CM | POA: Diagnosis not present

## 2022-04-23 DIAGNOSIS — D225 Melanocytic nevi of trunk: Secondary | ICD-10-CM | POA: Diagnosis not present

## 2022-04-23 DIAGNOSIS — L821 Other seborrheic keratosis: Secondary | ICD-10-CM | POA: Diagnosis not present

## 2022-04-23 DIAGNOSIS — Z1283 Encounter for screening for malignant neoplasm of skin: Secondary | ICD-10-CM | POA: Diagnosis not present

## 2022-04-23 DIAGNOSIS — L259 Unspecified contact dermatitis, unspecified cause: Secondary | ICD-10-CM | POA: Diagnosis not present

## 2022-04-30 ENCOUNTER — Ambulatory Visit: Payer: Medicare HMO | Admitting: Dermatology

## 2022-05-31 DIAGNOSIS — Z1231 Encounter for screening mammogram for malignant neoplasm of breast: Secondary | ICD-10-CM | POA: Diagnosis not present

## 2022-06-10 DIAGNOSIS — R350 Frequency of micturition: Secondary | ICD-10-CM | POA: Diagnosis not present

## 2022-06-10 DIAGNOSIS — N390 Urinary tract infection, site not specified: Secondary | ICD-10-CM | POA: Diagnosis not present

## 2022-06-11 DIAGNOSIS — N390 Urinary tract infection, site not specified: Secondary | ICD-10-CM | POA: Diagnosis not present

## 2022-06-11 DIAGNOSIS — R350 Frequency of micturition: Secondary | ICD-10-CM | POA: Diagnosis not present

## 2022-06-25 DIAGNOSIS — R7301 Impaired fasting glucose: Secondary | ICD-10-CM | POA: Diagnosis not present

## 2022-06-25 DIAGNOSIS — E7849 Other hyperlipidemia: Secondary | ICD-10-CM | POA: Diagnosis not present

## 2022-06-25 DIAGNOSIS — E0590 Thyrotoxicosis, unspecified without thyrotoxic crisis or storm: Secondary | ICD-10-CM | POA: Diagnosis not present

## 2022-07-09 DIAGNOSIS — I10 Essential (primary) hypertension: Secondary | ICD-10-CM | POA: Diagnosis not present

## 2022-07-09 DIAGNOSIS — E1140 Type 2 diabetes mellitus with diabetic neuropathy, unspecified: Secondary | ICD-10-CM | POA: Diagnosis not present

## 2022-07-09 DIAGNOSIS — L732 Hidradenitis suppurativa: Secondary | ICD-10-CM | POA: Diagnosis not present

## 2022-07-09 DIAGNOSIS — M542 Cervicalgia: Secondary | ICD-10-CM | POA: Diagnosis not present

## 2022-07-09 DIAGNOSIS — R7301 Impaired fasting glucose: Secondary | ICD-10-CM | POA: Diagnosis not present

## 2022-07-09 DIAGNOSIS — G5603 Carpal tunnel syndrome, bilateral upper limbs: Secondary | ICD-10-CM | POA: Diagnosis not present

## 2022-07-09 DIAGNOSIS — R5383 Other fatigue: Secondary | ICD-10-CM | POA: Diagnosis not present

## 2022-07-09 DIAGNOSIS — M545 Low back pain, unspecified: Secondary | ICD-10-CM | POA: Diagnosis not present

## 2022-07-09 DIAGNOSIS — E7849 Other hyperlipidemia: Secondary | ICD-10-CM | POA: Diagnosis not present

## 2022-07-09 DIAGNOSIS — E038 Other specified hypothyroidism: Secondary | ICD-10-CM | POA: Diagnosis not present

## 2022-08-01 DIAGNOSIS — E782 Mixed hyperlipidemia: Secondary | ICD-10-CM | POA: Diagnosis not present

## 2022-08-01 DIAGNOSIS — E039 Hypothyroidism, unspecified: Secondary | ICD-10-CM | POA: Diagnosis not present

## 2022-08-28 DIAGNOSIS — E669 Obesity, unspecified: Secondary | ICD-10-CM | POA: Diagnosis not present

## 2022-08-28 DIAGNOSIS — E785 Hyperlipidemia, unspecified: Secondary | ICD-10-CM | POA: Diagnosis not present

## 2022-08-28 DIAGNOSIS — Z8249 Family history of ischemic heart disease and other diseases of the circulatory system: Secondary | ICD-10-CM | POA: Diagnosis not present

## 2022-08-28 DIAGNOSIS — E039 Hypothyroidism, unspecified: Secondary | ICD-10-CM | POA: Diagnosis not present

## 2022-08-28 DIAGNOSIS — M1990 Unspecified osteoarthritis, unspecified site: Secondary | ICD-10-CM | POA: Diagnosis not present

## 2022-08-28 DIAGNOSIS — J45909 Unspecified asthma, uncomplicated: Secondary | ICD-10-CM | POA: Diagnosis not present

## 2022-08-28 DIAGNOSIS — F411 Generalized anxiety disorder: Secondary | ICD-10-CM | POA: Diagnosis not present

## 2022-08-28 DIAGNOSIS — R32 Unspecified urinary incontinence: Secondary | ICD-10-CM | POA: Diagnosis not present

## 2022-08-28 DIAGNOSIS — F325 Major depressive disorder, single episode, in full remission: Secondary | ICD-10-CM | POA: Diagnosis not present

## 2022-08-28 DIAGNOSIS — R011 Cardiac murmur, unspecified: Secondary | ICD-10-CM | POA: Diagnosis not present

## 2022-08-28 DIAGNOSIS — R609 Edema, unspecified: Secondary | ICD-10-CM | POA: Diagnosis not present

## 2022-08-28 DIAGNOSIS — Z008 Encounter for other general examination: Secondary | ICD-10-CM | POA: Diagnosis not present

## 2022-08-28 DIAGNOSIS — I10 Essential (primary) hypertension: Secondary | ICD-10-CM | POA: Diagnosis not present

## 2022-10-05 DIAGNOSIS — E1140 Type 2 diabetes mellitus with diabetic neuropathy, unspecified: Secondary | ICD-10-CM | POA: Diagnosis not present

## 2022-10-05 DIAGNOSIS — Z6833 Body mass index (BMI) 33.0-33.9, adult: Secondary | ICD-10-CM | POA: Diagnosis not present

## 2022-10-05 DIAGNOSIS — G629 Polyneuropathy, unspecified: Secondary | ICD-10-CM | POA: Diagnosis not present

## 2022-10-05 DIAGNOSIS — R030 Elevated blood-pressure reading, without diagnosis of hypertension: Secondary | ICD-10-CM | POA: Diagnosis not present

## 2022-10-07 DIAGNOSIS — U071 COVID-19: Secondary | ICD-10-CM | POA: Diagnosis not present

## 2022-10-07 DIAGNOSIS — Z20822 Contact with and (suspected) exposure to covid-19: Secondary | ICD-10-CM | POA: Diagnosis not present

## 2022-10-07 DIAGNOSIS — R070 Pain in throat: Secondary | ICD-10-CM | POA: Diagnosis not present

## 2022-10-31 DIAGNOSIS — R300 Dysuria: Secondary | ICD-10-CM | POA: Diagnosis not present

## 2022-10-31 DIAGNOSIS — Z6834 Body mass index (BMI) 34.0-34.9, adult: Secondary | ICD-10-CM | POA: Diagnosis not present

## 2022-11-19 DIAGNOSIS — M5412 Radiculopathy, cervical region: Secondary | ICD-10-CM | POA: Diagnosis not present

## 2022-11-26 DIAGNOSIS — Z6834 Body mass index (BMI) 34.0-34.9, adult: Secondary | ICD-10-CM | POA: Diagnosis not present

## 2022-11-26 DIAGNOSIS — J019 Acute sinusitis, unspecified: Secondary | ICD-10-CM | POA: Diagnosis not present

## 2022-11-26 DIAGNOSIS — R519 Headache, unspecified: Secondary | ICD-10-CM | POA: Diagnosis not present

## 2022-11-26 DIAGNOSIS — I10 Essential (primary) hypertension: Secondary | ICD-10-CM | POA: Diagnosis not present

## 2022-12-05 DIAGNOSIS — M5412 Radiculopathy, cervical region: Secondary | ICD-10-CM | POA: Diagnosis not present

## 2022-12-07 DIAGNOSIS — R350 Frequency of micturition: Secondary | ICD-10-CM | POA: Diagnosis not present

## 2022-12-07 DIAGNOSIS — N390 Urinary tract infection, site not specified: Secondary | ICD-10-CM | POA: Diagnosis not present

## 2022-12-20 DIAGNOSIS — M47812 Spondylosis without myelopathy or radiculopathy, cervical region: Secondary | ICD-10-CM | POA: Diagnosis not present

## 2022-12-20 DIAGNOSIS — M791 Myalgia, unspecified site: Secondary | ICD-10-CM | POA: Diagnosis not present

## 2023-01-10 DIAGNOSIS — H35033 Hypertensive retinopathy, bilateral: Secondary | ICD-10-CM | POA: Diagnosis not present

## 2023-01-10 DIAGNOSIS — Z0100 Encounter for examination of eyes and vision without abnormal findings: Secondary | ICD-10-CM | POA: Diagnosis not present

## 2023-01-10 DIAGNOSIS — H524 Presbyopia: Secondary | ICD-10-CM | POA: Diagnosis not present

## 2023-01-16 DIAGNOSIS — R7303 Prediabetes: Secondary | ICD-10-CM | POA: Diagnosis not present

## 2023-01-16 DIAGNOSIS — I10 Essential (primary) hypertension: Secondary | ICD-10-CM | POA: Diagnosis not present

## 2023-01-16 DIAGNOSIS — E782 Mixed hyperlipidemia: Secondary | ICD-10-CM | POA: Diagnosis not present

## 2023-01-16 DIAGNOSIS — R609 Edema, unspecified: Secondary | ICD-10-CM | POA: Diagnosis not present

## 2023-01-16 DIAGNOSIS — Z6832 Body mass index (BMI) 32.0-32.9, adult: Secondary | ICD-10-CM | POA: Diagnosis not present

## 2023-03-01 DIAGNOSIS — E039 Hypothyroidism, unspecified: Secondary | ICD-10-CM | POA: Diagnosis not present

## 2023-03-01 DIAGNOSIS — R7303 Prediabetes: Secondary | ICD-10-CM | POA: Diagnosis not present

## 2023-03-01 DIAGNOSIS — R609 Edema, unspecified: Secondary | ICD-10-CM | POA: Diagnosis not present

## 2023-03-01 DIAGNOSIS — I10 Essential (primary) hypertension: Secondary | ICD-10-CM | POA: Diagnosis not present

## 2023-03-01 DIAGNOSIS — E782 Mixed hyperlipidemia: Secondary | ICD-10-CM | POA: Diagnosis not present

## 2023-03-08 DIAGNOSIS — M545 Low back pain, unspecified: Secondary | ICD-10-CM | POA: Diagnosis not present

## 2023-03-08 DIAGNOSIS — M25552 Pain in left hip: Secondary | ICD-10-CM | POA: Diagnosis not present

## 2023-03-15 DIAGNOSIS — M545 Low back pain, unspecified: Secondary | ICD-10-CM | POA: Diagnosis not present

## 2023-04-01 DIAGNOSIS — R7303 Prediabetes: Secondary | ICD-10-CM | POA: Diagnosis not present

## 2023-04-01 DIAGNOSIS — R609 Edema, unspecified: Secondary | ICD-10-CM | POA: Diagnosis not present

## 2023-04-01 DIAGNOSIS — I10 Essential (primary) hypertension: Secondary | ICD-10-CM | POA: Diagnosis not present

## 2023-04-01 DIAGNOSIS — E039 Hypothyroidism, unspecified: Secondary | ICD-10-CM | POA: Diagnosis not present

## 2023-04-01 DIAGNOSIS — E782 Mixed hyperlipidemia: Secondary | ICD-10-CM | POA: Diagnosis not present

## 2023-04-04 ENCOUNTER — Encounter: Payer: Self-pay | Admitting: Internal Medicine

## 2023-04-07 DIAGNOSIS — S6991XA Unspecified injury of right wrist, hand and finger(s), initial encounter: Secondary | ICD-10-CM | POA: Diagnosis not present

## 2023-04-07 DIAGNOSIS — E039 Hypothyroidism, unspecified: Secondary | ICD-10-CM | POA: Diagnosis not present

## 2023-04-07 DIAGNOSIS — Z79899 Other long term (current) drug therapy: Secondary | ICD-10-CM | POA: Diagnosis not present

## 2023-04-07 DIAGNOSIS — W010XXA Fall on same level from slipping, tripping and stumbling without subsequent striking against object, initial encounter: Secondary | ICD-10-CM | POA: Diagnosis not present

## 2023-04-07 DIAGNOSIS — M79641 Pain in right hand: Secondary | ICD-10-CM | POA: Diagnosis not present

## 2023-04-07 DIAGNOSIS — M25552 Pain in left hip: Secondary | ICD-10-CM | POA: Diagnosis not present

## 2023-04-07 DIAGNOSIS — M25531 Pain in right wrist: Secondary | ICD-10-CM | POA: Diagnosis not present

## 2023-04-07 DIAGNOSIS — S60911A Unspecified superficial injury of right wrist, initial encounter: Secondary | ICD-10-CM | POA: Diagnosis not present

## 2023-04-07 DIAGNOSIS — W101XXA Fall (on)(from) sidewalk curb, initial encounter: Secondary | ICD-10-CM | POA: Diagnosis not present

## 2023-04-07 DIAGNOSIS — S60921A Unspecified superficial injury of right hand, initial encounter: Secondary | ICD-10-CM | POA: Diagnosis not present

## 2023-04-07 DIAGNOSIS — E7800 Pure hypercholesterolemia, unspecified: Secondary | ICD-10-CM | POA: Diagnosis not present

## 2023-04-07 DIAGNOSIS — Z885 Allergy status to narcotic agent status: Secondary | ICD-10-CM | POA: Diagnosis not present

## 2023-04-07 DIAGNOSIS — I10 Essential (primary) hypertension: Secondary | ICD-10-CM | POA: Diagnosis not present

## 2023-04-11 ENCOUNTER — Encounter: Payer: Self-pay | Admitting: Internal Medicine

## 2023-04-11 ENCOUNTER — Ambulatory Visit: Payer: Medicare HMO | Attending: Internal Medicine | Admitting: Internal Medicine

## 2023-04-11 VITALS — BP 138/80 | HR 73 | Ht 63.0 in | Wt 207.2 lb

## 2023-04-11 DIAGNOSIS — R002 Palpitations: Secondary | ICD-10-CM

## 2023-04-11 DIAGNOSIS — I350 Nonrheumatic aortic (valve) stenosis: Secondary | ICD-10-CM | POA: Insufficient documentation

## 2023-04-11 DIAGNOSIS — R079 Chest pain, unspecified: Secondary | ICD-10-CM | POA: Insufficient documentation

## 2023-04-11 DIAGNOSIS — M7989 Other specified soft tissue disorders: Secondary | ICD-10-CM | POA: Diagnosis not present

## 2023-04-11 MED ORDER — ASPIRIN 81 MG PO TBEC: 81.0000 mg | DELAYED_RELEASE_TABLET | Freq: Every day | ORAL | 5 refills | Status: AC

## 2023-04-11 NOTE — Patient Instructions (Signed)
 Medication Instructions:  Your physician has recommended you make the following change in your medication:  Start Aspirin 81 mg once daily Continue taking all other medications as prescribed  Labwork: None  Testing/Procedures: Your physician has requested that you have an echocardiogram. Echocardiography is a painless test that uses sound waves to create images of your heart. It provides your doctor with information about the size and shape of your heart and how well your heart's chambers and valves are working. This procedure takes approximately one hour. There are no restrictions for this procedure. Please do NOT wear cologne, perfume, aftershave, or lotions (deodorant is allowed). Please arrive 15 minutes prior to your appointment time.  Please note: We ask at that you not bring children with you during ultrasound (echo/ vascular) testing. Due to room size and safety concerns, children are not allowed in the ultrasound rooms during exams. Our front office staff cannot provide observation of children in our lobby area while testing is being conducted. An adult accompanying a patient to their appointment will only be allowed in the ultrasound room at the discretion of the ultrasound technician under special circumstances. We apologize for any inconvenience.  Your physician has recommended that you wear an event monitor. Event monitors are medical devices that record the heart's electrical activity. Doctors most often [place] these monitors to diagnose arrhythmias. Arrhythmias are problems with the speed or rhythm of the heartbeat. The monitor is a small, portable device. You can wear one while you do your normal daily activities. This is usually used to diagnose what is causing palpitations/syncope (passing out).   Follow-Up: Your physician recommends that you schedule a follow-up appointment in: 1 year. You will receive a reminder call in about 8 months reminding you to schedule your appointment. If  you don't receive this call, please contact our office.   Any Other Special Instructions Will Be Listed Below (If Applicable). Thank you for choosing [facility]!     If you need a refill on your cardiac medications before your next appointment, please call your pharmacy.

## 2023-04-11 NOTE — Progress Notes (Signed)
 Cardiology Office Note  Date: 04/11/2023   ID: Brentley, Horrell November 21, 1955, MRN [id number]  PCP:  Estanislado Pandy, MD  Cardiologist:  Marjo Bicker, MD Electrophysiologist:  None   History of Present Illness: DREAMER CARILLO is a 68 y.o. female known to have nonobstructive CAD, HTN, HLD was referred to cardiology clinic for evaluation of chest tightness and palpitations.  Ongoing palpitations that occur 4-5 times per week that can last for few weeks followed by palpitation free intervals that can also last for few weeks.  Palpitation frequency is very variable.  It can last for about a minute or so.  She reported having chest pains with stress not with exertion.  No SOB with exertion, has some.  No syncope.  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  She gets stressed out and gets chest pains after she visits her mother.  Past Medical History:  Diagnosis Date   Coronary atherosclerosis    Nonobstructive by cardiac CTA with FFR May 2021   Essential hypertension    Hypothyroidism    Mixed hyperlipidemia    SCC (squamous cell carcinoma) 12/20/2014   RIGHT CHEEK TX CAURET AND CAUTERY   SCC (squamous cell carcinoma) 01/28/2018   LEFT OUTER CALF TX WITH BX   SCCA (squamous cell carcinoma) of skin 04/25/2020   Anterior Mid Neck (in situ)(CX35FU)   Squamous cell carcinoma of skin 07/28/2014   LEFT POST UPPER ARM  CURET AND CAUTERY    Past Surgical History:  Procedure Laterality Date   APPENDECTOMY  1971   CHOLECYSTECTOMY  2006   FOOT SURGERY  06/06/05   bunion   TONSILLECTOMY AND ADENOIDECTOMY     TUBAL LIGATION  1982   URETHRA SURGERY  06-2003   urethral suspension    Current Outpatient Medications  Medication Sig Dispense Refill   ALBUTEROL IN Inhale 2 puffs into the lungs every 4 (four) hours as needed.     atorvastatin (LIPITOR) 20 MG tablet TAKE 1 TABLET BY MOUTH EVERY DAY 90 tablet 2   furosemide (LASIX) 40 MG tablet Take 40 mg by mouth daily.      ibuprofen (ADVIL) 200 MG tablet Take 800 mg by mouth every 6 (six) hours as needed.     Ivermectin (SOOLANTRA) 1 % CREA Apply 1 application topically daily. 45 g 2   levothyroxine (SYNTHROID, LEVOTHROID) 175 MCG tablet Take 175 mcg by mouth every other day.      lisinopril (PRINIVIL,ZESTRIL) 10 MG tablet Take 10 mg by mouth daily.     Magnesium 250 MG TABS Take 250 mg by mouth.     No current facility-administered medications for this visit.   Allergies:  Codeine   Social History: The patient  reports that she quit smoking about 29 years ago. Her smoking use included cigarettes. She started smoking about 50 years ago. She has a 41 pack-year smoking history. She has never used smokeless tobacco. She reports current alcohol use. She reports that she does not use drugs.   Family History: The patient's family history includes Cervical cancer in her mother; Heart attack in her father.   ROS:  Please see the history of present illness. Otherwise, complete review of systems is positive for none  All other systems are reviewed and negative.   Physical Exam: VS:  BP 138/80   Pulse 73   Ht 5\' 3"  (1.6 m)   Wt 207 lb 3.2 oz (94 kg)   SpO2  96%   BMI 36.70 kg/m , BMI Body mass index is 36.7 kg/m.  Wt Readings from Last 3 Encounters:  04/11/23 207 lb 3.2 oz (94 kg)  01/26/21 200 lb (90.7 kg)  12/21/20 200 lb (90.7 kg)    General: Patient appears comfortable at rest. HEENT: Conjunctiva and lids normal, oropharynx clear with moist mucosa. Neck: Supple, no elevated JVP or carotid bruits, no thyromegaly. Lungs: Clear to auscultation, nonlabored breathing at rest. Cardiac: Regular rate and rhythm, no S3 or significant systolic murmur, no pericardial rub. Abdomen: Soft, nontender, no hepatomegaly, bowel sounds present, no guarding or rebound. Extremities: 1+ pitting edema Skin: Warm and dry. Musculoskeletal: No kyphosis. Neuropsychiatric: Alert and oriented x3, affect grossly  appropriate.  Recent Labwork: No results found for requested labs within last 365 days.  No results found for: "CHOL", "TRIG", "HDL", "CHOLHDL", "VLDL", "LDLCALC", "LDLDIRECT"   Assessment and Plan:   Palpitations: Occurs 4-5 times per week for few weeks followed by palpitation free interval lasting for a few weeks as well.  Will obtain 30-day event monitor to rule out any malignant arrhythmias.  Chest pain likely secondary to stress: She underwent CT cardiac in 2021 that showed nonobstructive CAD.  Due to significantly elevated coronary calcium score of 1046 which is 90% for age and sex matched control, recommend aspirin 81 mg once daily.  Mild aortic valve stenosis in 2023: Lower ejection systolic murmur with S2.  Consistent with mild to moderate aortic valve stenosis. will obtain echocardiogram.  Leg swelling, 1+ pitting: Update echocardiogram as stated above.  HTN, controlled: Continue current antihypertensives, lisinopril 10 mg once daily.  HLD, unknown values: Continue atorvastatin 20 mg nightly.  Goal LDL less than 70.      Medication Adjustments/Labs and Tests Ordered: Current medicines are reviewed at length with the patient today.  Concerns regarding medicines are outlined above.    Disposition:  Follow up 1 year  Signed Cassandre Oleksy Verne Spurr, MD, 04/11/2023 1:50 PM    [facility] [address] [place], [place], [place] [address]

## 2023-04-18 DIAGNOSIS — R002 Palpitations: Secondary | ICD-10-CM | POA: Diagnosis not present

## 2023-04-24 ENCOUNTER — Telehealth: Payer: Self-pay | Admitting: Internal Medicine

## 2023-04-24 NOTE — Telephone Encounter (Signed)
 Pt states that since placing monitor 1 week ago she has developed a rash between her breast and around monitor. Pt states that she has had to push the event monitor several times while wearing. Pt is asking if she may remove and send back? Please advise.

## 2023-04-24 NOTE — Telephone Encounter (Signed)
 Pt came into office expressed that the heart monitor she has to wear for 1 month is breaking her out and she would like someone to look at it.

## 2023-04-25 NOTE — Telephone Encounter (Signed)
 Pt came into [place] office expressed that she has not heard from anyone. She said she can not use cortisone cream as the monitor wont stick if she does. I did inform her that [place] had reached out to Dr.Mallipeddi and we have not heard back.

## 2023-04-25 NOTE — Telephone Encounter (Signed)
 Pt notified to remove monitor and mail back.

## 2023-04-29 ENCOUNTER — Ambulatory Visit: Attending: Internal Medicine

## 2023-04-29 DIAGNOSIS — I350 Nonrheumatic aortic (valve) stenosis: Secondary | ICD-10-CM

## 2023-04-29 DIAGNOSIS — I080 Rheumatic disorders of both mitral and aortic valves: Secondary | ICD-10-CM

## 2023-04-29 DIAGNOSIS — I5030 Unspecified diastolic (congestive) heart failure: Secondary | ICD-10-CM | POA: Diagnosis not present

## 2023-04-29 LAB — ECHOCARDIOGRAM COMPLETE
AR max vel: 2.1 cm2
AV Area VTI: 2.11 cm2
AV Area mean vel: 2.1 cm2
AV Mean grad: 15 mmHg
AV Peak grad: 29.2 mmHg
Ao pk vel: 2.7 m/s
Area-P 1/2: 3.01 cm2
Calc EF: 70.3 %
MV VTI: 2.85 cm2
S' Lateral: 2 cm
Single Plane A2C EF: 70.7 %
Single Plane A4C EF: 69.5 %

## 2023-05-01 DIAGNOSIS — E782 Mixed hyperlipidemia: Secondary | ICD-10-CM | POA: Diagnosis not present

## 2023-05-01 DIAGNOSIS — R609 Edema, unspecified: Secondary | ICD-10-CM | POA: Diagnosis not present

## 2023-05-01 DIAGNOSIS — I10 Essential (primary) hypertension: Secondary | ICD-10-CM | POA: Diagnosis not present

## 2023-05-01 DIAGNOSIS — R7303 Prediabetes: Secondary | ICD-10-CM | POA: Diagnosis not present

## 2023-05-01 DIAGNOSIS — E039 Hypothyroidism, unspecified: Secondary | ICD-10-CM | POA: Diagnosis not present

## 2023-05-07 DIAGNOSIS — M47812 Spondylosis without myelopathy or radiculopathy, cervical region: Secondary | ICD-10-CM | POA: Diagnosis not present

## 2023-05-07 DIAGNOSIS — M5416 Radiculopathy, lumbar region: Secondary | ICD-10-CM | POA: Diagnosis not present

## 2023-05-10 ENCOUNTER — Telehealth: Payer: Self-pay

## 2023-05-10 NOTE — Telephone Encounter (Signed)
 The patient has been notified of the result and verbalized understanding.  All questions (if any) were answered. Camilo Cella, [place] 05/10/2023 11:03 AM

## 2023-05-10 NOTE — Telephone Encounter (Signed)
-----   Message from Vishnu P Mallipeddi sent at 05/09/2023 12:26 PM EDT ----- Normal LVEF, G1 DD with elevated LVEDP, normal RV function, mild aortic valve stenosis (unchanged from prior echocardiogram), CVP 3 mmHg.  Currently on p.o. Lasix 40 mg once daily, okay to continue.

## 2023-05-24 DIAGNOSIS — M4726 Other spondylosis with radiculopathy, lumbar region: Secondary | ICD-10-CM | POA: Diagnosis not present

## 2023-05-24 DIAGNOSIS — M542 Cervicalgia: Secondary | ICD-10-CM | POA: Diagnosis not present

## 2023-05-29 DIAGNOSIS — M542 Cervicalgia: Secondary | ICD-10-CM | POA: Diagnosis not present

## 2023-05-29 DIAGNOSIS — M4726 Other spondylosis with radiculopathy, lumbar region: Secondary | ICD-10-CM | POA: Diagnosis not present

## 2023-05-31 DIAGNOSIS — E039 Hypothyroidism, unspecified: Secondary | ICD-10-CM | POA: Diagnosis not present

## 2023-05-31 DIAGNOSIS — R7303 Prediabetes: Secondary | ICD-10-CM | POA: Diagnosis not present

## 2023-05-31 DIAGNOSIS — R609 Edema, unspecified: Secondary | ICD-10-CM | POA: Diagnosis not present

## 2023-05-31 DIAGNOSIS — E782 Mixed hyperlipidemia: Secondary | ICD-10-CM | POA: Diagnosis not present

## 2023-05-31 DIAGNOSIS — I10 Essential (primary) hypertension: Secondary | ICD-10-CM | POA: Diagnosis not present

## 2023-06-07 DIAGNOSIS — Z008 Encounter for other general examination: Secondary | ICD-10-CM | POA: Diagnosis not present

## 2023-06-10 DIAGNOSIS — M4726 Other spondylosis with radiculopathy, lumbar region: Secondary | ICD-10-CM | POA: Diagnosis not present

## 2023-06-10 DIAGNOSIS — M542 Cervicalgia: Secondary | ICD-10-CM | POA: Diagnosis not present

## 2023-06-12 DIAGNOSIS — Z1231 Encounter for screening mammogram for malignant neoplasm of breast: Secondary | ICD-10-CM | POA: Diagnosis not present

## 2023-06-12 DIAGNOSIS — M542 Cervicalgia: Secondary | ICD-10-CM | POA: Diagnosis not present

## 2023-06-12 DIAGNOSIS — M4726 Other spondylosis with radiculopathy, lumbar region: Secondary | ICD-10-CM | POA: Diagnosis not present

## 2023-06-17 DIAGNOSIS — M4726 Other spondylosis with radiculopathy, lumbar region: Secondary | ICD-10-CM | POA: Diagnosis not present

## 2023-06-17 DIAGNOSIS — M542 Cervicalgia: Secondary | ICD-10-CM | POA: Diagnosis not present

## 2023-06-18 DIAGNOSIS — R300 Dysuria: Secondary | ICD-10-CM | POA: Diagnosis not present

## 2023-06-18 DIAGNOSIS — Z6834 Body mass index (BMI) 34.0-34.9, adult: Secondary | ICD-10-CM | POA: Diagnosis not present

## 2023-07-01 DIAGNOSIS — R7303 Prediabetes: Secondary | ICD-10-CM | POA: Diagnosis not present

## 2023-07-01 DIAGNOSIS — R609 Edema, unspecified: Secondary | ICD-10-CM | POA: Diagnosis not present

## 2023-07-01 DIAGNOSIS — I10 Essential (primary) hypertension: Secondary | ICD-10-CM | POA: Diagnosis not present

## 2023-07-01 DIAGNOSIS — E039 Hypothyroidism, unspecified: Secondary | ICD-10-CM | POA: Diagnosis not present

## 2023-07-01 DIAGNOSIS — E782 Mixed hyperlipidemia: Secondary | ICD-10-CM | POA: Diagnosis not present

## 2023-07-08 DIAGNOSIS — M47816 Spondylosis without myelopathy or radiculopathy, lumbar region: Secondary | ICD-10-CM | POA: Diagnosis not present

## 2023-07-16 DIAGNOSIS — E669 Obesity, unspecified: Secondary | ICD-10-CM | POA: Diagnosis not present

## 2023-07-16 DIAGNOSIS — R300 Dysuria: Secondary | ICD-10-CM | POA: Diagnosis not present

## 2023-07-16 DIAGNOSIS — E039 Hypothyroidism, unspecified: Secondary | ICD-10-CM | POA: Diagnosis not present

## 2023-07-16 DIAGNOSIS — Z6833 Body mass index (BMI) 33.0-33.9, adult: Secondary | ICD-10-CM | POA: Diagnosis not present

## 2023-07-16 DIAGNOSIS — I10 Essential (primary) hypertension: Secondary | ICD-10-CM | POA: Diagnosis not present

## 2023-07-16 DIAGNOSIS — E7849 Other hyperlipidemia: Secondary | ICD-10-CM | POA: Diagnosis not present

## 2023-07-16 DIAGNOSIS — E782 Mixed hyperlipidemia: Secondary | ICD-10-CM | POA: Diagnosis not present

## 2023-07-20 DIAGNOSIS — Z6835 Body mass index (BMI) 35.0-35.9, adult: Secondary | ICD-10-CM | POA: Diagnosis not present

## 2023-07-20 DIAGNOSIS — Z20828 Contact with and (suspected) exposure to other viral communicable diseases: Secondary | ICD-10-CM | POA: Diagnosis not present

## 2023-07-20 DIAGNOSIS — R509 Fever, unspecified: Secondary | ICD-10-CM | POA: Diagnosis not present

## 2023-07-20 DIAGNOSIS — N390 Urinary tract infection, site not specified: Secondary | ICD-10-CM | POA: Diagnosis not present

## 2023-07-20 DIAGNOSIS — R300 Dysuria: Secondary | ICD-10-CM | POA: Diagnosis not present

## 2023-07-31 DIAGNOSIS — M47816 Spondylosis without myelopathy or radiculopathy, lumbar region: Secondary | ICD-10-CM | POA: Diagnosis not present

## 2023-08-01 DIAGNOSIS — R7303 Prediabetes: Secondary | ICD-10-CM | POA: Diagnosis not present

## 2023-08-01 DIAGNOSIS — E782 Mixed hyperlipidemia: Secondary | ICD-10-CM | POA: Diagnosis not present

## 2023-08-01 DIAGNOSIS — I10 Essential (primary) hypertension: Secondary | ICD-10-CM | POA: Diagnosis not present

## 2023-08-01 DIAGNOSIS — R609 Edema, unspecified: Secondary | ICD-10-CM | POA: Diagnosis not present

## 2023-08-01 DIAGNOSIS — E039 Hypothyroidism, unspecified: Secondary | ICD-10-CM | POA: Diagnosis not present

## 2023-08-12 DIAGNOSIS — R300 Dysuria: Secondary | ICD-10-CM | POA: Diagnosis not present

## 2023-08-12 DIAGNOSIS — N3001 Acute cystitis with hematuria: Secondary | ICD-10-CM | POA: Diagnosis not present

## 2023-08-12 DIAGNOSIS — Z6835 Body mass index (BMI) 35.0-35.9, adult: Secondary | ICD-10-CM | POA: Diagnosis not present

## 2023-08-15 DIAGNOSIS — M47816 Spondylosis without myelopathy or radiculopathy, lumbar region: Secondary | ICD-10-CM | POA: Diagnosis not present

## 2023-08-30 ENCOUNTER — Ambulatory Visit (INDEPENDENT_AMBULATORY_CARE_PROVIDER_SITE_OTHER): Admitting: Urology

## 2023-08-30 ENCOUNTER — Encounter: Payer: Self-pay | Admitting: Urology

## 2023-08-30 VITALS — BP 123/80 | HR 73

## 2023-08-30 DIAGNOSIS — R609 Edema, unspecified: Secondary | ICD-10-CM | POA: Diagnosis not present

## 2023-08-30 DIAGNOSIS — Z87440 Personal history of urinary (tract) infections: Secondary | ICD-10-CM

## 2023-08-30 DIAGNOSIS — E782 Mixed hyperlipidemia: Secondary | ICD-10-CM | POA: Diagnosis not present

## 2023-08-30 DIAGNOSIS — N390 Urinary tract infection, site not specified: Secondary | ICD-10-CM | POA: Diagnosis not present

## 2023-08-30 DIAGNOSIS — R7303 Prediabetes: Secondary | ICD-10-CM | POA: Diagnosis not present

## 2023-08-30 DIAGNOSIS — I10 Essential (primary) hypertension: Secondary | ICD-10-CM | POA: Diagnosis not present

## 2023-08-30 DIAGNOSIS — E039 Hypothyroidism, unspecified: Secondary | ICD-10-CM | POA: Diagnosis not present

## 2023-08-30 LAB — MICROSCOPIC EXAMINATION

## 2023-08-30 LAB — URINALYSIS, ROUTINE W REFLEX MICROSCOPIC
Bilirubin, UA: NEGATIVE
Glucose, UA: NEGATIVE
Ketones, UA: NEGATIVE
Nitrite, UA: NEGATIVE
Protein,UA: NEGATIVE
RBC, UA: NEGATIVE
Specific Gravity, UA: 1.01 (ref 1.005–1.030)
Urobilinogen, Ur: 0.2 mg/dL (ref 0.2–1.0)
pH, UA: 6.5 (ref 5.0–7.5)

## 2023-08-30 NOTE — Patient Instructions (Signed)

## 2023-08-30 NOTE — Progress Notes (Signed)
 08/30/2023 11:19 AM   Donny SQUIBB Till [birth year] [id number]  Referring provider: Atilano Deward ORN, MD [address] [place],  [place] [address]  Frequent UTI   HPI: Ms Ilyas is a [phone] here for evaluation of frequent UTI. UA today shows 11-30 WBCs and few bacteria. Over the past year she as gotten a UTI every 2-3 months and then in June she got 3 UTIs in a 7 week period. She was treated with bactrim and keflex. She denies a history of nephrolithiasis. No prior abdominal imaging. She denies any significant LUTS.    PMH: Past Medical History:  Diagnosis Date   Coronary atherosclerosis    Nonobstructive by cardiac CTA with FFR May 2021   Essential hypertension    Hypothyroidism    Mixed hyperlipidemia    SCC (squamous cell carcinoma) 12/20/2014   RIGHT CHEEK TX CAURET AND CAUTERY   SCC (squamous cell carcinoma) 01/28/2018   LEFT OUTER CALF TX WITH BX   SCCA (squamous cell carcinoma) of skin 04/25/2020   Anterior Mid Neck (in situ)(CX35FU)   Squamous cell carcinoma of skin 07/28/2014   LEFT POST UPPER ARM  CURET AND CAUTERY    Surgical History: Past Surgical History:  Procedure Laterality Date   APPENDECTOMY  1971   CHOLECYSTECTOMY  2006   FOOT SURGERY  06/06/05   bunion   TONSILLECTOMY AND ADENOIDECTOMY     TUBAL LIGATION  1982   URETHRA SURGERY  06-2003   urethral suspension    Home Medications:  Allergies as of 08/30/2023       Reactions   Codeine Nausea Only        Medication List        Accurate as of August 30, 2023 11:19 AM. If you have any questions, ask your nurse or doctor.          ALBUTEROL IN Inhale 2 puffs into the lungs every 4 (four) hours as needed.   aspirin  EC 81 MG tablet Take 1 tablet (81 mg total) by mouth daily. Swallow whole.   atorvastatin  20 MG tablet Commonly known as: LIPITOR TAKE 1 TABLET BY MOUTH EVERY DAY   furosemide 40 MG tablet Commonly known as: LASIX Take 40 mg by mouth daily.   ibuprofen 200 MG  tablet Commonly known as: ADVIL Take 800 mg by mouth every 6 (six) hours as needed.   Ivermectin  1 % Crea Commonly known as: Soolantra  Apply 1 application topically daily.   levothyroxine  175 MCG tablet Commonly known as: SYNTHROID  Take 175 mcg by mouth every other day.   lisinopril 10 MG tablet Commonly known as: ZESTRIL Take 10 mg by mouth daily.   Magnesium 250 MG Tabs Take 250 mg by mouth.        Allergies:  Allergies  Allergen Reactions   Codeine Nausea Only    Family History: Family History  Problem Relation Age of Onset   Heart attack Father    Cervical cancer Mother    Thyroid  disease Neg Hx     Social History:  reports that she quit smoking about 30 years ago. Her smoking use included cigarettes. She started smoking about 50 years ago. She has a 41 pack-year smoking history. She has never used smokeless tobacco. She reports current alcohol  use. She reports that she does not use drugs.  ROS: All other review of systems were reviewed and are negative except what is noted above in HPI  Physical Exam: BP 123/80   Pulse  73   Constitutional:  Alert and oriented, No acute distress. HEENT: [place] AT, moist mucus membranes.  Trachea midline, no masses. Cardiovascular: No clubbing, cyanosis, or edema. Respiratory: Normal respiratory effort, no increased work of breathing. GI: Abdomen is soft, nontender, nondistended, no abdominal masses GU: No CVA tenderness.  Lymph: No cervical or inguinal lymphadenopathy. Skin: No rashes, bruises or suspicious lesions. Neurologic: Grossly intact, no focal deficits, moving all 4 extremities. Psychiatric: Normal mood and affect.  Laboratory Data: Lab Results  Component Value Date   WBC 10.1 12/21/2020   HGB 12.3 12/21/2020   HCT 36.8 12/21/2020   MCV 90.2 12/21/2020   PLT 297 12/21/2020    Lab Results  Component Value Date   CREATININE 0.67 12/21/2020    No results found for: PSA  No results found for:  TESTOSTERONE  No results found for: HGBA1C  Urinalysis No results found for: COLORURINE, APPEARANCEUR, LABSPEC, PHURINE, GLUCOSEU, HGBUR, BILIRUBINUR, KETONESUR, PROTEINUR, UROBILINOGEN, NITRITE, LEUKOCYTESUR  No results found for: LABMICR, WBCUA, RBCUA, LABEPIT, MUCUS, BACTERIA  Pertinent Imaging:  No results found for this or any previous visit.  No results found for this or any previous visit.  No results found for this or any previous visit.  No results found for this or any previous visit.  No results found for this or any previous visit.  No results found for this or any previous visit.  No results found for this or any previous visit.  No results found for this or any previous visit.   Assessment & Plan:    1. Recurrent UTI (Primary) -We discussed the natural hx of recurrent UTIs and the various causes. We discussed the treatment options including post coital prophylaxis, daily prophylaxis, topical estrogen therapy. We will obtain CT hematuria and then office cystoscopy.  - Urinalysis, Routine w reflex microscopic - BLADDER SCAN AMB NON-IMAGING   No follow-ups on file.  Belvie Clara, MD  [facility] [place]

## 2023-08-30 NOTE — Progress Notes (Signed)
 Bladder Scan completed today.  Patient can void prior to the bladder scan. Bladder scan result: 2  Performed By: [facility] LPN

## 2023-09-01 LAB — URINE CULTURE

## 2023-10-01 DIAGNOSIS — R7303 Prediabetes: Secondary | ICD-10-CM | POA: Diagnosis not present

## 2023-10-01 DIAGNOSIS — E782 Mixed hyperlipidemia: Secondary | ICD-10-CM | POA: Diagnosis not present

## 2023-10-01 DIAGNOSIS — I10 Essential (primary) hypertension: Secondary | ICD-10-CM | POA: Diagnosis not present

## 2023-10-01 DIAGNOSIS — R609 Edema, unspecified: Secondary | ICD-10-CM | POA: Diagnosis not present

## 2023-10-01 DIAGNOSIS — E039 Hypothyroidism, unspecified: Secondary | ICD-10-CM | POA: Diagnosis not present

## 2023-10-04 ENCOUNTER — Ambulatory Visit (HOSPITAL_COMMUNITY)
Admission: RE | Admit: 2023-10-04 | Discharge: 2023-10-04 | Disposition: A | Discharge status: Home / Self Care | Source: Ambulatory Visit | Attending: Urology | Admitting: Urology

## 2023-10-04 DIAGNOSIS — M7989 Other specified soft tissue disorders: Secondary | ICD-10-CM | POA: Insufficient documentation

## 2023-10-04 DIAGNOSIS — R079 Chest pain, unspecified: Secondary | ICD-10-CM | POA: Insufficient documentation

## 2023-10-04 DIAGNOSIS — N390 Urinary tract infection, site not specified: Secondary | ICD-10-CM | POA: Diagnosis not present

## 2023-10-04 DIAGNOSIS — R319 Hematuria, unspecified: Secondary | ICD-10-CM | POA: Insufficient documentation

## 2023-10-04 DIAGNOSIS — Z9049 Acquired absence of other specified parts of digestive tract: Secondary | ICD-10-CM | POA: Diagnosis not present

## 2023-10-04 DIAGNOSIS — I350 Nonrheumatic aortic (valve) stenosis: Secondary | ICD-10-CM | POA: Insufficient documentation

## 2023-10-04 DIAGNOSIS — R160 Hepatomegaly, not elsewhere classified: Secondary | ICD-10-CM | POA: Diagnosis not present

## 2023-10-04 DIAGNOSIS — E785 Hyperlipidemia, unspecified: Secondary | ICD-10-CM | POA: Insufficient documentation

## 2023-10-04 MED ORDER — IOHEXOL 300 MG/ML  SOLN
125.0000 mL | Freq: Once | INTRAMUSCULAR | Status: AC | PRN
  Administered 2023-10-04: 125 mL via INTRAVENOUS

## 2023-10-08 LAB — POCT I-STAT CREATININE: Creatinine, Ser: 1 mg/dL (ref 0.44–1.00)

## 2023-10-14 ENCOUNTER — Ambulatory Visit (INDEPENDENT_AMBULATORY_CARE_PROVIDER_SITE_OTHER): Admitting: Urology

## 2023-10-14 ENCOUNTER — Encounter: Payer: Self-pay | Admitting: Urology

## 2023-10-14 VITALS — BP 122/65 | HR 85

## 2023-10-14 DIAGNOSIS — Z87440 Personal history of urinary (tract) infections: Secondary | ICD-10-CM | POA: Diagnosis not present

## 2023-10-14 DIAGNOSIS — N390 Urinary tract infection, site not specified: Secondary | ICD-10-CM

## 2023-10-14 DIAGNOSIS — Z09 Encounter for follow-up examination after completed treatment for conditions other than malignant neoplasm: Secondary | ICD-10-CM | POA: Diagnosis not present

## 2023-10-14 LAB — URINALYSIS, ROUTINE W REFLEX MICROSCOPIC
Bilirubin, UA: NEGATIVE
Glucose, UA: NEGATIVE
Ketones, UA: NEGATIVE
Nitrite, UA: NEGATIVE
Protein,UA: NEGATIVE
RBC, UA: NEGATIVE
Specific Gravity, UA: 1.015 (ref 1.005–1.030)
Urobilinogen, Ur: 0.2 mg/dL (ref 0.2–1.0)
pH, UA: 5.5 (ref 5.0–7.5)

## 2023-10-14 LAB — MICROSCOPIC EXAMINATION

## 2023-10-14 MED ORDER — CIPROFLOXACIN HCL 500 MG PO TABS
500.0000 mg | ORAL_TABLET | Freq: Once | ORAL | Status: AC
  Administered 2023-10-14: 500 mg via ORAL

## 2023-10-14 NOTE — Progress Notes (Signed)
   10/14/23  CC: frequent UTI   HPI: Ms Crystal Fischer is a 29bn here for cystoscopy for frequent UTI.  Blood pressure 122/65, pulse 85. NED. A&Ox3.   No respiratory distress   Abd soft, NT, ND Normal external genitalia with patent urethral meatus  Cystoscopy Procedure Note  Patient identification was confirmed, informed consent was obtained, and patient was prepped using Betadine solution.  Lidocaine jelly was administered per urethral meatus.    Procedure: - Flexible cystoscope introduced, without any difficulty.   - Thorough search of the bladder revealed:    normal urethral meatus    normal urothelium    no stones    no ulcers     no tumors    no urethral polyps    no trabeculation  - Ureteral orifices were normal in position and appearance.  Post-Procedure: - Patient tolerated the procedure well  Assessment/ Plan: Followup 3 months with UA. Patient to continue cranberry supplement   No follow-ups on file.  Crystal Clara, MD

## 2023-10-14 NOTE — Patient Instructions (Signed)

## 2023-11-01 DIAGNOSIS — R609 Edema, unspecified: Secondary | ICD-10-CM | POA: Diagnosis not present

## 2023-11-01 DIAGNOSIS — E039 Hypothyroidism, unspecified: Secondary | ICD-10-CM | POA: Diagnosis not present

## 2023-11-01 DIAGNOSIS — E782 Mixed hyperlipidemia: Secondary | ICD-10-CM | POA: Diagnosis not present

## 2023-11-01 DIAGNOSIS — R7303 Prediabetes: Secondary | ICD-10-CM | POA: Diagnosis not present

## 2023-11-01 DIAGNOSIS — I10 Essential (primary) hypertension: Secondary | ICD-10-CM | POA: Diagnosis not present

## 2023-11-21 DIAGNOSIS — M47816 Spondylosis without myelopathy or radiculopathy, lumbar region: Secondary | ICD-10-CM | POA: Diagnosis not present

## 2023-11-29 DIAGNOSIS — R7303 Prediabetes: Secondary | ICD-10-CM | POA: Diagnosis not present

## 2023-11-29 DIAGNOSIS — I10 Essential (primary) hypertension: Secondary | ICD-10-CM | POA: Diagnosis not present

## 2023-11-29 DIAGNOSIS — E782 Mixed hyperlipidemia: Secondary | ICD-10-CM | POA: Diagnosis not present

## 2023-11-29 DIAGNOSIS — E039 Hypothyroidism, unspecified: Secondary | ICD-10-CM | POA: Diagnosis not present

## 2023-11-29 DIAGNOSIS — R609 Edema, unspecified: Secondary | ICD-10-CM | POA: Diagnosis not present

## 2023-12-03 DIAGNOSIS — M47816 Spondylosis without myelopathy or radiculopathy, lumbar region: Secondary | ICD-10-CM | POA: Diagnosis not present

## 2023-12-03 DIAGNOSIS — M5412 Radiculopathy, cervical region: Secondary | ICD-10-CM | POA: Diagnosis not present

## 2024-02-12 ENCOUNTER — Ambulatory Visit: Admitting: Urology
